# Patient Record
Sex: Female | Born: 1965 | Race: White | Hispanic: No | Marital: Married | Smoking: Former smoker
Health system: Southern US, Community
[De-identification: ages and names within clinical notes are randomized; demographics above are authoritative.]

## PROBLEM LIST (undated history)

## (undated) DIAGNOSIS — B2790 Infectious mononucleosis, unspecified without complication: Secondary | ICD-10-CM

## (undated) DIAGNOSIS — F32A Depression, unspecified: Secondary | ICD-10-CM

## (undated) DIAGNOSIS — E063 Autoimmune thyroiditis: Secondary | ICD-10-CM

## (undated) DIAGNOSIS — K219 Gastro-esophageal reflux disease without esophagitis: Secondary | ICD-10-CM

## (undated) DIAGNOSIS — C50919 Malignant neoplasm of unspecified site of unspecified female breast: Secondary | ICD-10-CM

## (undated) DIAGNOSIS — F419 Anxiety disorder, unspecified: Secondary | ICD-10-CM

## (undated) DIAGNOSIS — F329 Major depressive disorder, single episode, unspecified: Secondary | ICD-10-CM

## (undated) DIAGNOSIS — C801 Malignant (primary) neoplasm, unspecified: Secondary | ICD-10-CM

## (undated) HISTORY — DX: Infectious mononucleosis, unspecified without complication: B27.90

## (undated) HISTORY — DX: Malignant (primary) neoplasm, unspecified: C80.1

## (undated) HISTORY — DX: Depression, unspecified: F32.A

## (undated) HISTORY — DX: Gastro-esophageal reflux disease without esophagitis: K21.9

## (undated) HISTORY — DX: Anxiety disorder, unspecified: F41.9

## (undated) HISTORY — DX: Autoimmune thyroiditis: E06.3

---
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
## 2007-08-23 HISTORY — PX: UTERINE FIBROID SURGERY: SHX826

## 2007-09-07 ENCOUNTER — Emergency Department (HOSPITAL_COMMUNITY): Admission: EM | Admit: 2007-09-07 | Discharge: 2007-09-07 | Payer: Self-pay | Admitting: Emergency Medicine

## 2007-09-20 ENCOUNTER — Ambulatory Visit (HOSPITAL_COMMUNITY): Admission: RE | Admit: 2007-09-20 | Discharge: 2007-09-20 | Payer: Self-pay | Admitting: Obstetrics and Gynecology

## 2007-09-20 ENCOUNTER — Encounter (INDEPENDENT_AMBULATORY_CARE_PROVIDER_SITE_OTHER): Payer: Self-pay | Admitting: Obstetrics and Gynecology

## 2010-03-11 ENCOUNTER — Encounter: Payer: Self-pay | Admitting: Maternal and Fetal Medicine

## 2010-05-25 ENCOUNTER — Encounter: Payer: Self-pay | Admitting: Pediatric Cardiology

## 2010-09-20 ENCOUNTER — Ambulatory Visit: Payer: Self-pay

## 2010-09-21 ENCOUNTER — Inpatient Hospital Stay: Payer: Self-pay

## 2011-01-04 NOTE — H&P (Signed)
NAMEHALLEY, Alexandra Byrd              ACCOUNT NO.:  [id number]   MEDICAL RECORD NO.:  [id number]          PATIENT TYPE:  AMB   LOCATION:  SDC                           FACILITY:  [facility]   PHYSICIAN:  Kendra H. Tenny Craw, MD     DATE OF BIRTH:  1965-09-20   DATE OF ADMISSION:  09/20/2007  DATE OF DISCHARGE:                              HISTORY & PHYSICAL   CHIEF COMPLAINT:  Heavy menstrual bleeding.   HISTORY OF PRESENT ILLNESS:  Ms. Karl Ito is a 45 year old G1, P1 who  presents with a six month history of irregular bleeding and cramping  with passage of clots.  She has noticed over the past 3-4 months, her  periods have become exceedingly irregular with much heavier bleeding and  they are lasting much longer than previous.  Her current period started  on December 19th, and she has continued to bleed since that time, which  has gotten heavier and lighter at times.  She was initially seen in the  office on August 31, 2007 for this complaint, and she was given a 10-day  course of Provera 10 mg p.o. daily.  This failed to stop her bleeding,  and she returned for the same complaint on September 13, 2007.  At that  time, a vaginal ultrasound was performed, which demonstrated a uterus  normal in size with an endometrium that was heterogenous in appearance  and irregularly thickened, measuring anywhere from 1 cm to 1.3 cm.  The  right ovary was within normal limits.  The left ovary had a similar-  appearing cyst on it.  After discussion of expected management versus  surgical management, the decision was made to proceed with a  hysteroscopy D&C for definitive management of dysfunctional uterine  bleeding and menorrhagia.   PAST MEDICAL HISTORY:  1. Hypothyroidism.  2. Gastroesophageal reflux disease.  3. Depression and anxiety.   PAST SURGICAL HISTORY:  1. Cesarean section in 1997 for an infant in breech presentation.  2. Some form of bladder surgery in 1974.   PAST OBSTETRIC HISTORY:  G1, P1,  status post a primary low transverse  cesarean section in 1997 for a breech presentation.   PAST GYN HISTORY:  History of an abnormal Pap x1.  Recheck was within  normal limits.  No sexually transmitted diseases.  Pap smear on August 31, 2007 was normal.   SOCIAL HISTORY:  Patient does smoke a pack of cigarettes daily for the  past two years.  She denies alcohol or drug abuse.   FAMILY HISTORY:  Noncontributory.   CURRENT MEDICATIONS:  1. Cymbalta 60 mg p.o. daily.  2. Synthroid 112 mcg p.o. daily.  3. Klonopin 1 mg p.o. daily.  4. Dexedrine 30 mg 1 p.o. daily.   ALLERGIES:  PENICILLIN, WELLBUTRIN.   PHYSICAL EXAMINATION:  Alert and oriented x3.  No apparent distress.  Blood pressure 126/78, weight 165 pounds.  Height 5 foot 8.  LUNGS: Clear to auscultation bilaterally.  HEART:  Regular rate and rhythm.  ABDOMEN:  Soft, nontender, nondistended.  GENITOURINARY:  Normal external female genitalia.  Vagina is pink and  well rugated.  Menstrual fluid is present.  Cervix is visualized without  any lesions.  No cervical motion tenderness is noted.  Uterus is small  and mid position; however, this is a difficult exam secondary to  involuntary guarding by the patient.  The patient does not demonstrate  any tenderness with this exam.  Adnexa are nontender and nonpalpable.  EXTREMITIES:  No clubbing, cyanosis or edema.   LABS:  White blood cell count of 11.4, hemoglobin 11, hematocrit 33,  platelet count 412.  This was drawn on September 13, 2007.   ASSESSMENT/PLAN:  This is a 45 year old G1, P1 with dysfunctional  uterine bleeding and menorrhagia for a hysteroscopy dilatation and  curettage.  1. Admit as outpatient.  2. Consent for hysteroscopy and D&C.      Freddrick March. Tenny Craw, MD  Electronically Signed     KHR/MEDQ  D:  09/18/2007  T:  09/18/2007  Job:  [id number]

## 2011-01-04 NOTE — Op Note (Signed)
[name], [name]              ACCOUNT NO.:  [id number]   MEDICAL RECORD NO.:  [id number]          PATIENT TYPE:  AMB   LOCATION:  SDC                           [facility]:  [facility]   PHYSICIAN:  Kendra H. Tenny Craw, MD     DATE OF BIRTH:  28-Mar-1966   DATE OF PROCEDURE:  09/20/2007  DATE OF DISCHARGE:                               OPERATIVE REPORT   PREOPERATIVE DIAGNOSIS:  Dysfunctional uterine bleeding.   POSTOPERATIVE DIAGNOSIS:  1. Dysfunctional uterine bleeding.  2. Submucosal uterine fibroids.  3. Endometrial polyps.   PROCEDURE:  Hysteroscopy D&C.   SURGEON:  Dr. Waynard Reeds.   ASSISTANT:  None.   ANESTHESIA:  MAC.   An irregular intrauterine cavity consistent with multiple uterine  endometrial polyps and submucosal fibroids.   SPECIMENS:  Endometrial curettings.   DISPOSITION OF SPECIMENS:  Pathology.   ESTIMATED BLOOD LOSS:  Minimal.   COMPLICATIONS:  None.   PROCEDURE:  Alexandra Byrd is a 45 year old, G1, P1 who has had a several  month history of dysfunctional uterine bleeding and menorrhagia who  presented for this complaint originally on August 31, 2007. At that time  she was given a 10-day course of Provera however, after the Provera she  continued to bleed heavily and the decision was made to proceed with  hysteroscopy D&C for diagnostic and therapeutic treatment. Following the  appropriate informed consent, she was brought to the operating room  where MAC anesthesia was administered and found to be adequate.  She was  placed in the lithotomy position in [place] stirrups, prepped and draped  in the normal sterile fashion.  A Graves speculum was placed in the  vagina.  A single-tooth tenaculum was used to grasp the anterior lip of  the cervix. 10 mL of 1% lidocaine was injected circumferentially in a  paracervical fashion. The uterus was sounded to 6 cm, the cervix was  serially dilated. The hysteroscope was then introduced transcervically  with direct  visualization.  Once inside the uterine cavity, the  endometrial cavity itself was noted to be irregular, lumpy, bumpy  appearing with multiple polyps noted and at least one submucosal  fibroid.  The tubal ostia were visualized at the fundus on both sides.  The hysteroscope was then removed and a sharp curettage was performed.  Again the sharp curettage did demonstrate the irregular nature of the  endometrial cavity consistent with submucosal fibroids, mainly  anteriorly. Endometrial curettings were removed. Also it did appear that  at least one very small fibroid was removed with the endometrial  curettings, three curettage passes were performed until a gritty texture  was noted. The hysteroscope was then free introduced through the cervix  into the intrauterine cavity and at this time the endometrial cavity was  much more smooth in appearance.  No further polyps could be identified.  The hysteroscope was removed from the intrauterine cavity.  The single-  toothed tenaculum was removed from the anterior lip of the cervix and  the Graves speculum was removed from the vagina. This completed the  operative case.  The patient was taken out of  the lithotomy position and  was brought to the recovery room in stable condition following the  procedure.      Freddrick March. Tenny Craw, MD  Electronically Signed     KHR/MEDQ  D:  09/20/2007  T:  09/20/2007  Job:  [id number]

## 2011-05-13 LAB — CBC
HCT: 33.3 — ABNORMAL LOW
Hemoglobin: 11 — ABNORMAL LOW
RDW: 13.3
WBC: 11.8 — ABNORMAL HIGH

## 2011-05-13 LAB — BASIC METABOLIC PANEL
BUN: 7
CO2: 25
Calcium: 8.5
Chloride: 104
Creatinine, Ser: 0.57
GFR calc Af Amer: >60
GFR calc non Af Amer: >60
Glucose, Bld: 96
Potassium: 3.8
Sodium: 137

## 2011-05-13 LAB — DIFFERENTIAL
Basophils Absolute: 0
Lymphocytes Relative: 17
Lymphs Abs: 2.1
Monocytes Absolute: 1.2 — ABNORMAL HIGH
Neutro Abs: 7.8 — ABNORMAL HIGH

## 2011-05-13 LAB — TYPE AND SCREEN: ABO/RH(D): A POS

## 2011-05-13 LAB — PREGNANCY, URINE: Preg Test, Ur: NEGATIVE

## 2011-05-13 LAB — ABO/RH: ABO/RH(D): A POS

## 2013-10-11 ENCOUNTER — Ambulatory Visit (HOSPITAL_COMMUNITY): Payer: Self-pay

## 2014-04-14 ENCOUNTER — Telehealth: Payer: Self-pay | Admitting: Orthopedic Surgery

## 2014-04-14 NOTE — Telephone Encounter (Signed)
Patient called to inquire about appointment for upper arm pain/shoulder area; states has treated with an integrative medicine provider in [facility], but is not getting much better.  Relayed treatment notes would be needed for Dr Aline Brochure to review.  States will request Dr. Tye Savoy in [place] to fax referral.

## 2014-04-21 NOTE — Telephone Encounter (Signed)
Referral received for schedule of appointment. Patient aware.

## 2014-05-06 ENCOUNTER — Ambulatory Visit (INDEPENDENT_AMBULATORY_CARE_PROVIDER_SITE_OTHER): Payer: BC Managed Care – PPO

## 2014-05-06 ENCOUNTER — Ambulatory Visit (INDEPENDENT_AMBULATORY_CARE_PROVIDER_SITE_OTHER): Payer: BC Managed Care – PPO | Admitting: Orthopedic Surgery

## 2014-05-06 VITALS — BP 111/79 | Ht 68.0 in | Wt 176.0 lb

## 2014-05-06 DIAGNOSIS — M75101 Unspecified rotator cuff tear or rupture of right shoulder, not specified as traumatic: Secondary | ICD-10-CM

## 2014-05-06 DIAGNOSIS — M7550 Bursitis of unspecified shoulder: Secondary | ICD-10-CM

## 2014-05-06 DIAGNOSIS — M25519 Pain in unspecified shoulder: Secondary | ICD-10-CM

## 2014-05-06 DIAGNOSIS — M25512 Pain in left shoulder: Secondary | ICD-10-CM

## 2014-05-06 DIAGNOSIS — M67919 Unspecified disorder of synovium and tendon, unspecified shoulder: Secondary | ICD-10-CM

## 2014-05-06 DIAGNOSIS — M719 Bursopathy, unspecified: Secondary | ICD-10-CM

## 2014-05-06 DIAGNOSIS — M75102 Unspecified rotator cuff tear or rupture of left shoulder, not specified as traumatic: Principal | ICD-10-CM

## 2014-05-06 MED ORDER — NABUMETONE 500 MG PO TABS: 500.0000 mg | ORAL_TABLET | Freq: Two times a day (BID) | ORAL | Status: DC

## 2014-05-06 NOTE — Progress Notes (Signed)
HPI:  Chief Complaint  Patient presents with  . Arm Pain    left upper arm pain x few months, no known injury   Her Tye Savoy is the physician. [facility] as the pharmacy. Complains of left and right upper shoulder pain x2 months no injury. Complains of sharp aching radiating pain left greater than right but both in the same areas of the left and right deltoid. Treatment includes a muscle rub. She has difficulty with internal.    ROS review of systems are negative except for heartburn joint pain limb pain muscle weakness difficulty moving her arm and pain in the joint. She also has headaches balance problems numbness tingling thyroid disorder and temperature disturbance anxiety and tailbone pain causing leg pain.  Surgery includes cesarean section fibroid excision and cesarean section again in 2012 the first one being in 1997 she takes following medications  Vitamin D3 vitamin K progesterone and another hormone she also takes ARMOUR  Medical history diabetes and thyroid disease family history of diabetes hypertension stroke heart attack cancer depression mental illness thyroid disease and history of penicillin causing a rash PHYSICAL EXAM  VITAL SIGNS: BP 111/79  Ht 5\' 8"  (1.727 m)  Wt 176 lb (79.833 kg)  BMI 26.77 kg/m2  LMP 05/06/2014   GENERAL well-developed well-nourished grooming hygiene normal  MENTAL STATUS alert and oriented x3  MOOD/AFFECT ARE NORMAL   GAIT normal without disturbance   EXAM OF THE CERVICAL spine and thoracic spine show normal skin no deformities no increased muscle tension and no range of motion deficit  Left and right shoulder: The skin is normal she has tenderness in the peri-acromial region. She has full passive motion in flexion and abduction external rotation with decreased range of motion in internal rotation, T7 on the right with T4 on the left. Both shoulders are stable the rotator cuff is intact  She has bilateral positive  impingement signs  Neurovascular exam is intact including bilateral equal and symmetric reflexes she has no evidence of lymphadenopathy in either axilla and sensory exam remains normal bilaterally   IMAGING STUDIES left shoulder x-ray looks normal   Dx bilateral rotator cuff syndrome   PLAN  bilateral shoulder injection Physical therapy both shoulders Meds ordered this encounter  Medications  . nabumetone (RELAFEN) 500 MG tablet    Sig: Take 1 tablet (500 mg total) by mouth 2 (two) times daily.    Dispense:  90 tablet    Refill:  0   Procedure note the subacromial injection shoulder right and left  Verbal consent was obtained to inject the  right and left  Shoulder  Timeout was completed to confirm the injection site is a subacromial space of the  right and left shoulder   Medication used Depo-Medrol 40 mg and lidocaine 1% 3 cc  Anesthesia was provided by ethyl chloride  The injection was performed in the right shoulder first followed by the left shoulder posterior subacromial space. After pinning the skin with alcohol and anesthetized the skin with ethyl chloride the subacromial space was injected using a 20-gauge needle. There were no complications  Sterile dressing was applied.

## 2014-05-06 NOTE — Patient Instructions (Signed)
Call to arrange therapy- [place]  Medication sent to your pharmacy

## 2015-08-23 HISTORY — PX: MASTECTOMY: SHX3

## 2015-10-23 ENCOUNTER — Other Ambulatory Visit: Payer: Self-pay | Admitting: Certified Nurse Midwife

## 2015-10-23 DIAGNOSIS — N63 Unspecified lump in unspecified breast: Secondary | ICD-10-CM

## 2015-11-03 ENCOUNTER — Ambulatory Visit
Admission: RE | Admit: 2015-11-03 | Discharge: 2015-11-03 | Disposition: A | Discharge status: Home / Self Care | Payer: BLUE CROSS/BLUE SHIELD | Source: Ambulatory Visit | Attending: Certified Nurse Midwife | Admitting: Certified Nurse Midwife

## 2015-11-03 DIAGNOSIS — N63 Unspecified lump in unspecified breast: Secondary | ICD-10-CM

## 2015-11-03 DIAGNOSIS — R921 Mammographic calcification found on diagnostic imaging of breast: Secondary | ICD-10-CM | POA: Insufficient documentation

## 2015-11-04 ENCOUNTER — Other Ambulatory Visit: Payer: Self-pay | Admitting: Certified Nurse Midwife

## 2015-11-04 DIAGNOSIS — R921 Mammographic calcification found on diagnostic imaging of breast: Secondary | ICD-10-CM

## 2015-11-04 DIAGNOSIS — N6320 Unspecified lump in the left breast, unspecified quadrant: Secondary | ICD-10-CM

## 2015-11-06 ENCOUNTER — Ambulatory Visit
Admission: RE | Admit: 2015-11-06 | Discharge: 2015-11-06 | Disposition: A | Discharge status: Home / Self Care | Payer: BLUE CROSS/BLUE SHIELD | Source: Ambulatory Visit | Attending: Certified Nurse Midwife | Admitting: Certified Nurse Midwife

## 2015-11-06 DIAGNOSIS — N6320 Unspecified lump in the left breast, unspecified quadrant: Secondary | ICD-10-CM

## 2015-11-06 DIAGNOSIS — R921 Mammographic calcification found on diagnostic imaging of breast: Secondary | ICD-10-CM

## 2015-11-06 DIAGNOSIS — N63 Unspecified lump in breast: Secondary | ICD-10-CM | POA: Diagnosis present

## 2015-11-06 DIAGNOSIS — C801 Malignant (primary) neoplasm, unspecified: Secondary | ICD-10-CM

## 2015-11-06 DIAGNOSIS — C50912 Malignant neoplasm of unspecified site of left female breast: Secondary | ICD-10-CM | POA: Diagnosis not present

## 2015-11-06 HISTORY — PX: BREAST BIOPSY: SHX20

## 2015-11-06 HISTORY — DX: Malignant (primary) neoplasm, unspecified: C80.1

## 2015-11-09 LAB — SURGICAL PATHOLOGY

## 2015-11-12 ENCOUNTER — Encounter: Payer: Self-pay | Admitting: *Deleted

## 2015-11-12 ENCOUNTER — Other Ambulatory Visit: Payer: Self-pay | Admitting: *Deleted

## 2015-11-12 ENCOUNTER — Encounter: Payer: Self-pay | Admitting: General Surgery

## 2015-11-12 ENCOUNTER — Ambulatory Visit (INDEPENDENT_AMBULATORY_CARE_PROVIDER_SITE_OTHER): Payer: BLUE CROSS/BLUE SHIELD | Admitting: General Surgery

## 2015-11-12 VITALS — BP 132/76 | HR 100 | Resp 16 | Ht 68.0 in | Wt 187.0 lb

## 2015-11-12 DIAGNOSIS — D0512 Intraductal carcinoma in situ of left breast: Secondary | ICD-10-CM

## 2015-11-12 NOTE — Patient Instructions (Addendum)
Ductal Carcinoma in Situ  Ductal carcinoma in situ is a growth of abnormal cells in the breast. The abnormal cells are located in the tubes that carry milk to the nipple (milk ducts) and have not spread to other areas. Ductal carcinoma in situ is the earliest form of breast cancer.  CAUSES  The exact cause of ductal carcinoma in situ is not known.  RISK FACTORS  Risk factors for ductal carcinoma in situ include:  · Age. Your risk increases as you get older.  · Family history of breast cancer.  · Having prior radiation treatments to your breasts or chest area.  · Being overweight.  · Using or having used hormones, such as estrogen.  · Prior history of:    Breast cancer.    Noncancerous breast conditions.    Dense breasts.  · Drinking more than one alcoholic beverage per day.  · Starting menstruation before the age of 12.  · Never having given birth.  · Giving birth to your first child when you were over the age of 35.  · Not breastfeeding, if you have given birth.  · Not exercising consistently.  SIGNS AND SYMPTOMS  Ductal carcinoma in situ does not cause any symptoms.  DIAGNOSIS   Ductal carcinoma in situ is usually discovered during a routine X-ray study of the breasts (mammogram). To diagnose the condition, your health care provider will take a tissue sample from your breast so it can be examined under a microscope (breast biopsy).  TREATMENT  Ductal carcinoma in situ treatment may include:  · A lumpectomy. This is the removal of the area of abnormal cells, along with a ring of normal tissue.  This may also be called breast-conserving surgery.  · A simple mastectomy. This is the removal of breast tissue, the nipple, and the circle of colored tissue around the nipple (areola). Sometimes, one or more lymph nodes from under the arm are also removed.  · Preventative mastectomy. This is the removal of both breasts. This is usually done only if you have a very high risk of developing breast cancer.  · Radiation. This is  a treatment that uses X-rays to kill cancer cells.  · Medicines to keep the cancer from spreading.  HOME CARE INSTRUCTIONS  · Take medicines only as directed by your health care provider.  · Keep all follow-up visits as directed by your health care provider. This is important.  · Limit alcohol intake to no more than 1 drink per day for nonpregnant women. One drink equals 12 ounces of beer, 5 ounces of wine, or 1½ ounces of hard liquor.  SEEK MEDICAL CARE IF:  You have a fever.  SEEK IMMEDIATE MEDICAL CARE IF:  You have difficulty breathing.     This information is not intended to replace advice given to you by your health care provider. Make sure you discuss any questions you have with your health care provider.     Document Released: 03/05/2014 Document Reviewed: 03/05/2014  Elsevier Interactive Patient Education ©2016 [employer]vier Inc.

## 2015-11-12 NOTE — Progress Notes (Signed)
Patient ID: Alexandra Byrd, female   DOB: [birth year], 50 y.o.   MRN: [id number]  Chief Complaint  Patient presents with  . Breast Problem    DCIS    HPI Alexandra Byrd is a 50 y.o. female.  who presents for a breast evaluation. The most recent mammogram with biopsy was done on 11-06-15.  Patient does not perform regular self breast checks and does not get regular mammograms done. Patient stated her GYN doctor found a lump during her exam 2 weeks ago. Denies pain and tenderness. She states that she previously had a HALO test done in 2014, no mammograms since. She stays at home with her daughter.  She states she has a rash on her right and left forearm that has been present since November 2016. Patient was accompanied by her husband and daughter.  I have reviewed the history of present illness with the patient.  HPI  Past Medical History  Diagnosis Date  . Cancer ([place]) 11-06-15    left breast  . Hashimoto's thyroiditis   . EBV infection   . GERD (gastroesophageal reflux disease)     Past Surgical History  Procedure Laterality Date  . Breast biopsy Left 11-06-15    DUCTAL CARCINOMA IN SITU, NUCLEAR GRADE 2,  . Cesarean section  1997, 2012  . Bladder surgery  1974  . Uterine fibroid surgery  2009    Family History  Problem Relation Age of Onset  . Cancer Mother     thyroid/throat    Social History Social History  Substance Use Topics  . Smoking status: Former Smoker -- 5 years  . Smokeless tobacco: Never Used  . Alcohol Use: None    Allergies  Allergen Reactions  . Penicillins Rash    Current Outpatient Prescriptions  Medication Sig Dispense Refill  . ARMOUR THYROID 60 MG tablet TK 2 TS PO IN THE MORNING  2  . Cholecalciferol (VITAMIN D3) 5000 units CAPS Take by mouth.    . Menaquinone-7 (VITAMIN K2) 100 MCG CAPS Take by mouth.    . progesterone (PROMETRIUM) 100 MG capsule      No current facility-administered medications for this visit.    Review of  Systems Review of Systems  Constitutional: Negative.   Respiratory: Negative.   Cardiovascular: Negative.     Blood pressure 132/76, pulse 100, resp. rate 16, height 5\' 8"  (1.727 m), weight 187 lb (84.823 kg), last menstrual period 11/04/2015.  Physical Exam Physical Exam  Constitutional: She is oriented to person, place, and time. She appears well-developed and well-nourished.  Eyes: Conjunctivae are normal. No scleral icterus.  Neck: Neck supple. No thyromegaly present.  Cardiovascular: Normal rate, regular rhythm and normal heart sounds.   Pulmonary/Chest: Effort normal and breath sounds normal. Right breast exhibits no inverted nipple, no mass, no nipple discharge, no skin change and no tenderness. Left breast exhibits mass. Left breast exhibits no inverted nipple, no nipple discharge, no skin change and no tenderness.  2 cm ill-defined mass in left breast 6 cm from nipple at 2 ocl location.  Lymphadenopathy:    She has no cervical adenopathy.    She has no axillary adenopathy.  Neurological: She is alert and oriented to person, place, and time.  Skin: Skin is warm and dry. Rash (Right and left forearm punctate, scattered) noted.  Psychiatric: She has a normal mood and affect. Her behavior is normal.    Data Reviewed Mammogram/ultrasound and notes reviewed. Mammoogram, density with microcalcifications in 2 locations of  left breast-US showing both areas with hypoechoic masses.  Biopsy of both these sites on left breast at 2ocl, subareolar and 6 cmfn-grade 2 DCIS. 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 SN biopsy can be done at same time. Discussed in detail current diagnosis, potential invasive component, role of hormones. Procedure of mastectomy, risks and benefits explained. Pt is agreeable.            Patient had lab  work this month done in [facility], she will bring labs for review.       PCP:  Mylinda Latina Ref: Clarita Leber   This information has been scribed by Verlene Mayer, CMA   Christene Lye 11/12/2015, 3:50 PM

## 2015-11-12 NOTE — Progress Notes (Signed)
Patient ID: Alexandra Byrd, female   DOB: 1966-06-10, 50 y.o.   MRN: [id number]  Patient's surgery has been scheduled for 11-24-15 at [facility].

## 2015-11-13 ENCOUNTER — Encounter: Payer: Self-pay | Admitting: *Deleted

## 2015-11-13 NOTE — Progress Notes (Signed)
  Oncology Nurse Navigator Documentation  Navigator Location: [facility] (11/13/15 1500) Navigator Encounter Type: Introductory phone call (11/13/15 1500)   Abnormal Finding Date: 11/03/15 (11/13/15 1500) Confirmed Diagnosis Date: 11/06/15 (11/13/15 1500) Surgery Date: 11/24/15 (11/13/15 1500) Treatment Initiated Date: 11/24/15 (11/13/15 1500)     Barriers/Navigation Needs: Education (11/13/15 1500) Education: Newly Diagnosed Cancer Education (11/13/15 1500)              Acuity: Level 2 (11/13/15 1500)         Time Spent with Patient: 30 (11/13/15 1500)   Talked to patient today to establish navigation services.  Patient is scheduled for surgery on 11/24/15.  Will [employer] on Monday.  She is to call if she has any questions or needs.

## 2015-11-16 ENCOUNTER — Telehealth: Payer: Self-pay | Admitting: *Deleted

## 2015-11-16 ENCOUNTER — Ambulatory Visit: Payer: Self-pay

## 2015-11-16 ENCOUNTER — Other Ambulatory Visit: Payer: Self-pay

## 2015-11-16 NOTE — Telephone Encounter (Signed)
Patient was contacted today to notify her of her arrival time the day of surgery which is scheduled for 11-24-15 at [facility]. This patient verbalizes understanding.   This patient also mentioned that her husband would like for her to get a second opinion with the [place]. They are supposed to be in touch with patient about getting an appointment set up.   Patient wishes to leave surgery as scheduled for now but may call at some point to cancel to allow for second opinion prior to surgery.

## 2015-11-18 ENCOUNTER — Telehealth: Payer: Self-pay | Admitting: *Deleted

## 2015-11-18 ENCOUNTER — Other Ambulatory Visit: Payer: BC Managed Care – PPO

## 2015-11-18 NOTE — Telephone Encounter (Signed)
Patient called to report that she wants to cancel left breast mastectomy with SLN node that was scheduled for 11-24-15 at [facility].  This patient states that she is going to get a second opinion from the [place] before proceeding with any surgery.   Leah in the [place] been notified of cancellation today as well as Felicia from Scheduling and Aaron Edelman from Nuclear Medicine.

## 2015-11-20 ENCOUNTER — Encounter
Admission: RE | Disposition: A | Discharge status: Home / Self Care | Payer: Self-pay | Source: Ambulatory Visit | Attending: Obstetrics & Gynecology

## 2015-11-20 ENCOUNTER — Ambulatory Visit: Payer: BLUE CROSS/BLUE SHIELD | Admitting: Anesthesiology

## 2015-11-20 ENCOUNTER — Ambulatory Visit
Admission: RE | Admit: 2015-11-20 | Discharge: 2015-11-20 | Disposition: A | Discharge status: Home / Self Care | Payer: BLUE CROSS/BLUE SHIELD | Source: Ambulatory Visit | Attending: Obstetrics & Gynecology | Admitting: Obstetrics & Gynecology

## 2015-11-20 DIAGNOSIS — Z833 Family history of diabetes mellitus: Secondary | ICD-10-CM | POA: Diagnosis not present

## 2015-11-20 DIAGNOSIS — K219 Gastro-esophageal reflux disease without esophagitis: Secondary | ICD-10-CM | POA: Diagnosis not present

## 2015-11-20 DIAGNOSIS — Z9851 Tubal ligation status: Secondary | ICD-10-CM | POA: Insufficient documentation

## 2015-11-20 DIAGNOSIS — Z808 Family history of malignant neoplasm of other organs or systems: Secondary | ICD-10-CM | POA: Insufficient documentation

## 2015-11-20 DIAGNOSIS — Z79899 Other long term (current) drug therapy: Secondary | ICD-10-CM | POA: Diagnosis not present

## 2015-11-20 DIAGNOSIS — Z9889 Other specified postprocedural states: Secondary | ICD-10-CM | POA: Diagnosis not present

## 2015-11-20 DIAGNOSIS — N8500 Endometrial hyperplasia, unspecified: Secondary | ICD-10-CM | POA: Diagnosis not present

## 2015-11-20 DIAGNOSIS — Z853 Personal history of malignant neoplasm of breast: Secondary | ICD-10-CM | POA: Insufficient documentation

## 2015-11-20 DIAGNOSIS — E063 Autoimmune thyroiditis: Secondary | ICD-10-CM | POA: Diagnosis not present

## 2015-11-20 DIAGNOSIS — N939 Abnormal uterine and vaginal bleeding, unspecified: Secondary | ICD-10-CM | POA: Insufficient documentation

## 2015-11-20 DIAGNOSIS — Z880 Allergy status to penicillin: Secondary | ICD-10-CM | POA: Diagnosis not present

## 2015-11-20 DIAGNOSIS — D0512 Intraductal carcinoma in situ of left breast: Secondary | ICD-10-CM

## 2015-11-20 HISTORY — PX: HYSTEROSCOPY W/D&C: SHX1775

## 2015-11-20 LAB — TYPE AND SCREEN
ABO/RH(D): A POS
Antibody Screen: NEGATIVE

## 2015-11-20 LAB — ABO/RH: ABO/RH(D): A POS

## 2015-11-20 LAB — POCT PREGNANCY, URINE: Preg Test, Ur: NEGATIVE

## 2015-11-20 SURGERY — DILATATION AND CURETTAGE /HYSTEROSCOPY
Anesthesia: General | Wound class: Clean Contaminated

## 2015-11-20 MED ORDER — EPHEDRINE SULFATE 50 MG/ML IJ SOLN
INTRAMUSCULAR | Status: DC | PRN
  Administered 2015-11-20: 10 mg via INTRAVENOUS

## 2015-11-20 MED ORDER — ACETAMINOPHEN 650 MG RE SUPP
650.0000 mg | RECTAL | Status: DC | PRN
  Filled 2015-11-20: qty 1

## 2015-11-20 MED ORDER — FENTANYL CITRATE (PF) 100 MCG/2ML IJ SOLN
25.0000 ug | INTRAMUSCULAR | Status: DC | PRN
  Administered 2015-11-20 (×4): 25 ug via INTRAVENOUS

## 2015-11-20 MED ORDER — SUGAMMADEX SODIUM 200 MG/2ML IV SOLN
INTRAVENOUS | Status: DC | PRN
  Administered 2015-11-20: 167.8 mg via INTRAVENOUS

## 2015-11-20 MED ORDER — DEXAMETHASONE SODIUM PHOSPHATE 10 MG/ML IJ SOLN
INTRAMUSCULAR | Status: DC | PRN
  Administered 2015-11-20: 10 mg via INTRAVENOUS

## 2015-11-20 MED ORDER — LIDOCAINE HCL 1 % IJ SOLN
INTRAMUSCULAR | Status: DC | PRN
  Administered 2015-11-20: 10 mL

## 2015-11-20 MED ORDER — LACTATED RINGERS IV BOLUS (SEPSIS)
500.0000 mL | Freq: Once | INTRAVENOUS | Status: AC
  Administered 2015-11-20: 500 mL via INTRAVENOUS

## 2015-11-20 MED ORDER — ROCURONIUM BROMIDE 100 MG/10ML IV SOLN
INTRAVENOUS | Status: DC | PRN
  Administered 2015-11-20: 5 mg via INTRAVENOUS
  Administered 2015-11-20: 15 mg via INTRAVENOUS

## 2015-11-20 MED ORDER — ACETAMINOPHEN 325 MG PO TABS: 650.0000 mg | ORAL_TABLET | ORAL | Status: DC | PRN

## 2015-11-20 MED ORDER — FENTANYL CITRATE (PF) 100 MCG/2ML IJ SOLN
INTRAMUSCULAR | Status: AC
  Administered 2015-11-20: 25 ug via INTRAVENOUS
  Filled 2015-11-20: qty 2

## 2015-11-20 MED ORDER — MIDAZOLAM HCL 2 MG/2ML IJ SOLN
INTRAMUSCULAR | Status: DC | PRN
  Administered 2015-11-20: 2 mg via INTRAVENOUS

## 2015-11-20 MED ORDER — PROPOFOL 10 MG/ML IV BOLUS
INTRAVENOUS | Status: DC | PRN
  Administered 2015-11-20: 150 mg via INTRAVENOUS
  Administered 2015-11-20: 50 mg via INTRAVENOUS

## 2015-11-20 MED ORDER — SUCCINYLCHOLINE CHLORIDE 20 MG/ML IJ SOLN
INTRAMUSCULAR | Status: DC | PRN
  Administered 2015-11-20: 100 mg via INTRAVENOUS

## 2015-11-20 MED ORDER — CEFAZOLIN SODIUM-DEXTROSE 2-4 GM/100ML-% IV SOLN: 2.0000 g | INTRAVENOUS | Status: DC

## 2015-11-20 MED ORDER — CEFAZOLIN SODIUM-DEXTROSE 2-4 GM/100ML-% IV SOLN
INTRAVENOUS | Status: AC
  Filled 2015-11-20: qty 100

## 2015-11-20 MED ORDER — KETOROLAC TROMETHAMINE 30 MG/ML IJ SOLN
INTRAMUSCULAR | Status: AC
  Administered 2015-11-20: 30 mg via INTRAVENOUS
  Filled 2015-11-20: qty 1

## 2015-11-20 MED ORDER — KETOROLAC TROMETHAMINE 30 MG/ML IJ SOLN
30.0000 mg | Freq: Four times a day (QID) | INTRAMUSCULAR | Status: DC
  Administered 2015-11-20: 30 mg via INTRAVENOUS
  Filled 2015-11-20 (×5): qty 1

## 2015-11-20 MED ORDER — SILVER NITRATE-POT NITRATE 75-25 % EX MISC
CUTANEOUS | Status: DC | PRN
  Administered 2015-11-20: 2

## 2015-11-20 MED ORDER — SILVER NITRATE-POT NITRATE 75-25 % EX MISC
CUTANEOUS | Status: AC
  Filled 2015-11-20: qty 1

## 2015-11-20 MED ORDER — CHLORHEXIDINE GLUCONATE 4 % EX LIQD
Freq: Once | CUTANEOUS | Status: DC
Start: 2015-11-20 — End: 2015-11-20

## 2015-11-20 MED ORDER — IBUPROFEN 600 MG PO TABS: 600.0000 mg | ORAL_TABLET | Freq: Four times a day (QID) | ORAL | Status: DC | PRN

## 2015-11-20 MED ORDER — LIDOCAINE HCL (CARDIAC) 20 MG/ML IV SOLN
INTRAVENOUS | Status: DC | PRN
  Administered 2015-11-20: 60 mg via INTRAVENOUS

## 2015-11-20 MED ORDER — ONDANSETRON HCL 4 MG/2ML IJ SOLN
INTRAMUSCULAR | Status: DC | PRN
  Administered 2015-11-20: 4 mg via INTRAVENOUS

## 2015-11-20 MED ORDER — ONDANSETRON HCL 4 MG/2ML IJ SOLN: 4.0000 mg | Freq: Once | INTRAMUSCULAR | Status: DC | PRN

## 2015-11-20 MED ORDER — LIDOCAINE HCL (PF) 1 % IJ SOLN
INTRAMUSCULAR | Status: AC
  Filled 2015-11-20: qty 30

## 2015-11-20 MED ORDER — PHENYLEPHRINE HCL 10 MG/ML IJ SOLN
INTRAMUSCULAR | Status: DC | PRN
  Administered 2015-11-20 (×4): 100 ug via INTRAVENOUS

## 2015-11-20 MED ORDER — MORPHINE SULFATE (PF) 2 MG/ML IV SOLN: 1.0000 mg | INTRAVENOUS | Status: DC | PRN

## 2015-11-20 MED ORDER — FENTANYL CITRATE (PF) 100 MCG/2ML IJ SOLN
INTRAMUSCULAR | Status: DC | PRN
  Administered 2015-11-20 (×2): 50 ug via INTRAVENOUS

## 2015-11-20 MED ORDER — CHLORHEXIDINE GLUCONATE 4 % EX LIQD: 1.0000 "application " | Freq: Once | CUTANEOUS | Status: DC

## 2015-11-20 MED ORDER — LACTATED RINGERS IV SOLN
INTRAVENOUS | Status: DC
  Administered 2015-11-20 (×2): via INTRAVENOUS

## 2015-11-20 SURGICAL SUPPLY — 17 items
CORD URO TURP 10FT (MISCELLANEOUS) IMPLANT
ELECT REM PT RETURN 9FT ADLT (ELECTROSURGICAL) ×2
ELECT RESECT POWERBALL 24F (MISCELLANEOUS) IMPLANT
ELECTRODE REM PT RTRN 9FT ADLT (ELECTROSURGICAL) ×1 IMPLANT
GLOVE PI ORTHOPRO 6.5 (GLOVE) ×1
GLOVE PI ORTHOPRO STRL 6.5 (GLOVE) ×1 IMPLANT
GLOVE SURG SYN 6.5 ES PF (GLOVE) ×2 IMPLANT
GOWN STRL REUS W/ TWL LRG LVL3 (GOWN DISPOSABLE) ×2 IMPLANT
GOWN STRL REUS W/TWL LRG LVL3 (GOWN DISPOSABLE) ×2
IV LACTATED RINGERS 1000ML (IV SOLUTION) ×2 IMPLANT
KIT RM TURNOVER CYSTO AR (KITS) ×2 IMPLANT
NEEDLE SPNL 22GX3.5 QUINCKE BK (NEEDLE) ×2 IMPLANT
PACK DNC HYST (MISCELLANEOUS) ×2 IMPLANT
PAD OB MATERNITY 4.3X12.25 (PERSONAL CARE ITEMS) ×2 IMPLANT
PAD PREP 24X41 OB/GYN DISP (PERSONAL CARE ITEMS) ×2 IMPLANT
SYRINGE 10CC LL (SYRINGE) ×2 IMPLANT
TUBING CONNECTING 10 (TUBING) ×2 IMPLANT

## 2015-11-20 NOTE — Anesthesia Postprocedure Evaluation (Signed)
Anesthesia Post Note  Patient: Alexandra Byrd  Procedure(s) Performed: Procedure(s) (LRB): DILATATION AND CURETTAGE /HYSTEROSCOPY (N/A)  Patient location during evaluation: PACU Anesthesia Type: General Level of consciousness: awake and alert Pain management: pain level controlled Vital Signs Assessment: post-procedure vital signs reviewed and stable Respiratory status: spontaneous breathing and respiratory function stable Cardiovascular status: stable Anesthetic complications: no    Last Vitals:  Filed Vitals:   11/20/15 1654 11/20/15 1711  BP: 110/53 131/65  Pulse: 103 116  Temp: 36.2 C   Resp: 16 15    Last Pain: There were no vitals filed for this visit.               Elim Economou K

## 2015-11-20 NOTE — Anesthesia Procedure Notes (Signed)
Procedure Name: Intubation Date/Time: 11/20/2015 4:06 PM Performed by: Aline Brochure Pre-anesthesia Checklist: Emergency Drugs available, Patient identified, Suction available and Patient being monitored Patient Re-evaluated:Patient Re-evaluated prior to inductionPreoxygenation: Pre-oxygenation with 100% oxygen Intubation Type: IV induction and Cricoid Pressure applied Ventilation: Mask ventilation with difficulty Laryngoscope Size: Mac and 3 Grade View: Grade III Tube type: Oral Tube size: 7.0 mm Number of attempts: 1 Airway Equipment and Method: Bougie stylet Placement Confirmation: positive ETCO2 and breath sounds checked- equal and bilateral Secured at: 22 cm Tube secured with: Tape Dental Injury: Teeth and Oropharynx as per pre-operative assessment  Difficulty Due To: Difficult Airway- due to anterior larynx Future Recommendations: Recommend- induction with short-acting agent, and alternative techniques readily available

## 2015-11-20 NOTE — Progress Notes (Signed)
Up to bsc felt like she needed to void  No success

## 2015-11-20 NOTE — Transfer of Care (Signed)
Immediate Anesthesia Transfer of Care Note  Patient: Alexandra Byrd  Procedure(s) Performed: Procedure(s): DILATATION AND CURETTAGE /HYSTEROSCOPY (N/A)  Patient Location: PACU  Anesthesia Type:General  Level of Consciousness: awake, alert  and oriented  Airway & Oxygen Therapy: Patient Spontanous Breathing and Patient connected to face mask oxygen  Post-op Assessment: Report given to RN and Post -op Vital signs reviewed and stable  Post vital signs: stable  Last Vitals:  Filed Vitals:   11/20/15 1413 11/20/15 1654  BP: 122/93 110/53  Pulse: 100 103  Temp: 36.8 C 36.2 C  Resp: 16 16    Complications: No apparent anesthesia complications

## 2015-11-20 NOTE — Op Note (Signed)
Operative Report Hysteroscopy, Dilation and Curettage 11/20/2015  Patient:  Alexandra Byrd  50 y.o. female Preoperative diagnosis:  abnormal uterine bleeding Postoperative diagnosis:  abnormal uterine bleeding  PROCEDURE:  Procedure(s): DILATATION AND CURETTAGE /HYSTEROSCOPY (N/A) Surgeon:  Surgeon(s) and Role:    * Janaisa Birkland Loletha Grayer Carold Eisner, MD - Primary  Anesthesia:  general I/O:  600cc in, none out, minimal blood loss Specimens:  Endometrial curettings Complications: None Apparent Disposition:  VS stable to PACU  Findings: Uterus, mobile, normal size, sounding to 8 cm; normal cervix, vagina, perineum. Operative findings minimally fluffy endometrium, no polyps, no scarring, no masses.  Indication for procedure/Consents: 49 y.o. G2P2  here for scheduled surgery for the aforementioned diagnoses.   Risks of surgery were discussed with the patient including but not limited to: bleeding which may require transfusion; infection which may require antibiotics; injury to uterus or surrounding organs; intrauterine scarring which may impair future fertility; need for additional procedures including laparotomy or laparoscopy; and other postoperative/anesthesia complications. Written informed consent was obtained.    Procedure Details:   The patient was then taken to the operating room where anesthesia was administered and was found to be adequate.  After a formal and adequate timeout was performed, she was placed in the dorsal lithotomy position and examined with the above findings. She was then prepped and draped in the sterile manner.  A speculum was then placed in the patient's vagina and a single tooth tenaculum was applied to the anterior lip of the cervix.  A paracervical block was placed.  The uterus was sounded to 8cm. Her cervix was serially dilated to accommodate the hysteroscope, with findings as above. A sharp curettage was then performed until there was a gritty texture in all four quadrants. The  specimen was handed off to nursing.  The camera was reinserted and confirmed the uterus had been evacuated. The tenaculum was removed from the anterior lip of the cervix and the vaginal speculum was removed after noting good hemostasis. The patient tolerated the procedure well and was taken to the recovery area awake, extubated and in stable condition.  The patient will be discharged to home as per PACU criteria.  Routine postoperative instructions given. She will follow up in the clinic in two to four weeks for postoperative evaluation.  Larey Days, MD [facility] OBGYN Attending Gynecologist

## 2015-11-20 NOTE — H&P (Signed)
H&P Update  PLEASE SEE PAPER H&P  Pt was last seen in my office, and complete history and physical performed.  The surgical history has been reviewed and remains accurate without interval change. The patient was re-examined and patient's physiologic condition has not changed significantly in the last 30 days.  No new pharmacological allergies or types of therapy has been initiated.  Allergies  Allergen Reactions  . Penicillins Rash    Past Medical History  Diagnosis Date  . Cancer ([place]) 11-06-15    left breast  . Hashimoto's thyroiditis   . EBV infection   . GERD (gastroesophageal reflux disease)    Past Surgical History  Procedure Laterality Date  . Breast biopsy Left 11-06-15    DUCTAL CARCINOMA IN SITU, NUCLEAR GRADE 2,  . Cesarean section  1997, 2012  . Bladder surgery  1974  . Uterine fibroid surgery  2009    BP 122/93 mmHg  Pulse 100  Temp(Src) 98.2 F (36.8 C) (Tympanic)  Resp 16  Ht 5\' 8"  (1.727 m)  Wt 83.915 kg (185 lb)  BMI 28.14 kg/m2  SpO2 100%  LMP 11/04/2015  NAD RRR no murmurs CTAB, no wheezing, resps unlabored +BS, soft, NTTP No c/c/e Pelvic exam deferred  The above history was confirmed with the patient. The condition still exists that makes this procedure necessary. Surgical plan includes dilation and curettage, hysteroscopy, as confirmed on the consent. The treatment plan remains the same, without new options for care.  The patient understands the potential benefits and risks and the consents have been signed and placed on the chart.     Larey Days, MD Attending Obstetrician Gynecologist [facility]

## 2015-11-20 NOTE — Discharge Instructions (Signed)
You should expect to have some cramping and vaginal bleeding for about a week. This should taper off and subside, much like a period. If heavy bleeding continues or gets worse, you should contact the office for an earlier appointment.   Please call the office or physician on call for fever >101, severe pain, and heavy bleeding.   [place]!!  Please speak to your oncologist about hormonal treatments available for you, and whether they will effect your uterus and/or ovaries.     AMBULATORY SURGERY  DISCHARGE INSTRUCTIONS   1) The drugs that you were given will stay in your system until tomorrow so for the next 24 hours you should not:  A) Drive an automobile B) Make any legal decisions C) Drink any alcoholic beverage   2) You may resume regular meals tomorrow.  Today it is better to start with liquids and gradually work up to solid foods.  You may eat anything you prefer, but it is better to start with liquids, then soup and crackers, and gradually work up to solid foods.   3) Please notify your doctor immediately if you have any unusual bleeding, trouble breathing, redness and pain at the surgery site, drainage, fever, or pain not relieved by medication.    4) Additional Instructions:     Please contact your physician with any problems or Same Day Surgery at [phone], Monday through Friday 6 am to 4 pm, or [place] at [facility] number at [phone].

## 2015-11-20 NOTE — Anesthesia Preprocedure Evaluation (Signed)
Anesthesia Evaluation  Patient identified by MRN, date of birth, ID band Patient awake    Reviewed: Allergy & Precautions, NPO status , Patient's Chart, lab work & pertinent test results  History of Anesthesia Complications Negative for: history of anesthetic complications  Airway Mallampati: II       Dental  (+) Implants   Pulmonary neg pulmonary ROS, former smoker,           Cardiovascular negative cardio ROS       Neuro/Psych negative neurological ROS  negative psych ROS   GI/Hepatic negative GI ROS, Neg liver ROS, GERD  Medicated and Poorly Controlled,  Endo/Other  negative endocrine ROSdiabetes (borderline)Hyperthyroidism   Renal/GU negative Renal ROS     Musculoskeletal   Abdominal   Peds  Hematology   Anesthesia Other Findings   Reproductive/Obstetrics                             Anesthesia Physical Anesthesia Plan  ASA: II  Anesthesia Plan: General   Post-op Pain Management:    Induction: Intravenous  Airway Management Planned: Oral ETT  Additional Equipment:   Intra-op Plan:   Post-operative Plan:   Informed Consent: I have reviewed the patients History and Physical, chart, labs and discussed the procedure including the risks, benefits and alternatives for the proposed anesthesia with the patient or authorized representative who has indicated his/her understanding and acceptance.     Plan Discussed with:   Anesthesia Plan Comments:         Anesthesia Quick Evaluation

## 2015-11-23 ENCOUNTER — Encounter: Payer: Self-pay | Admitting: Obstetrics & Gynecology

## 2015-11-24 ENCOUNTER — Encounter: Admission: RE | Payer: Self-pay | Source: Ambulatory Visit

## 2015-11-24 ENCOUNTER — Ambulatory Visit
Admission: RE | Admit: 2015-11-24 | Payer: BLUE CROSS/BLUE SHIELD | Source: Ambulatory Visit | Admitting: General Surgery

## 2015-11-24 ENCOUNTER — Ambulatory Visit: Payer: BLUE CROSS/BLUE SHIELD

## 2015-11-24 LAB — SURGICAL PATHOLOGY

## 2015-11-24 SURGERY — MASTECTOMY WITH SENTINEL LYMPH NODE BIOPSY
Anesthesia: Choice | Laterality: Left

## 2015-12-24 ENCOUNTER — Encounter: Payer: Self-pay | Admitting: *Deleted

## 2015-12-24 NOTE — Progress Notes (Signed)
  Oncology Nurse Navigator Documentation  Navigator Location: [facility] Onc (12/24/15 0900) Navigator Encounter Type: Telephone (12/24/15 0900)               Barriers/Navigation Needs: No barriers at this time (12/24/15 0900)                          Time Spent with Patient: 15 (12/24/15 0900)   Called patient today.  She had cancelled her surgery scheduled for this month.  States she is going to the [facility] in [place].  States she is scheduled for surgery later this month.  Offered support.

## 2016-05-10 ENCOUNTER — Telehealth: Payer: Self-pay | Admitting: *Deleted

## 2016-05-10 NOTE — Telephone Encounter (Signed)
Talked to patient today.  She is doing well.  States she had a mastectomy, and 5 weeks of radiation therapy at the [facility] in [place].  States she is now on Tamoxifen and tolerating it well.  She is waiting on reconstructive surgery in upcoming year.  She is going to send me a copy of her final path report for our records.  She is to call if she has any questions or needs.

## 2019-01-07 ENCOUNTER — Encounter: Payer: Self-pay | Admitting: *Deleted

## 2019-01-07 NOTE — Progress Notes (Signed)
  Oncology Nurse Navigator Documentation  Navigator Location: [facility] (01/07/19 0800) Referral date to RadOnc/MedOnc: 01/09/19 (01/07/19 0800) )Navigator Encounter Type: Telephone (01/07/19 0800) Telephone: [facility] Call (01/07/19 0800)                       Barriers/Navigation Needs: Coordination of Care (01/07/19 0800)   Interventions: Coordination of Care (01/07/19 0800)   Coordination of Care: Appts (01/07/19 0800)                  Time Spent with Patient: 30 (01/07/19 0800)   Patient called last Thursday and wanted to set up a free mammogram.  She also wants to establish medical oncology.  She was diagnosed here with breast cancer in 2017, and went to the [facility] for her treatment.  She states they have released her and encouraged her to continue follow up with an oncologist near home.  The patient lives in [place] and wants to come here for her follow-up and next mammogram.  She is currently uninsured, but does not meet BCCCP eligibility. We discussed as a patient in our facility we could get a free mammogram through our [employer]. I have scheduled her a medical oncology appointment for 01/09/19 @ 10:45 with Dr. Tasia Catchings.  I have called her back to give her the appointment, but no answer and no voicemail.  Will try back again later.

## 2019-01-09 ENCOUNTER — Encounter (INDEPENDENT_AMBULATORY_CARE_PROVIDER_SITE_OTHER): Payer: Self-pay

## 2019-01-09 ENCOUNTER — Other Ambulatory Visit: Payer: Self-pay

## 2019-01-09 ENCOUNTER — Encounter: Payer: Self-pay | Admitting: Oncology

## 2019-01-09 ENCOUNTER — Inpatient Hospital Stay: Payer: Medicaid - Out of State

## 2019-01-09 ENCOUNTER — Inpatient Hospital Stay: Payer: Medicaid - Out of State | Attending: Oncology | Admitting: Oncology

## 2019-01-09 VITALS — BP 118/80 | HR 102 | Temp 97.7°F | Wt 188.0 lb

## 2019-01-09 DIAGNOSIS — Z853 Personal history of malignant neoplasm of breast: Secondary | ICD-10-CM | POA: Diagnosis present

## 2019-01-09 DIAGNOSIS — Z87891 Personal history of nicotine dependence: Secondary | ICD-10-CM

## 2019-01-09 DIAGNOSIS — F32A Depression, unspecified: Secondary | ICD-10-CM

## 2019-01-09 DIAGNOSIS — F329 Major depressive disorder, single episode, unspecified: Secondary | ICD-10-CM | POA: Diagnosis not present

## 2019-01-09 DIAGNOSIS — C50212 Malignant neoplasm of upper-inner quadrant of left female breast: Secondary | ICD-10-CM | POA: Diagnosis not present

## 2019-01-09 DIAGNOSIS — F419 Anxiety disorder, unspecified: Secondary | ICD-10-CM | POA: Diagnosis not present

## 2019-01-09 DIAGNOSIS — Z9012 Acquired absence of left breast and nipple: Secondary | ICD-10-CM | POA: Diagnosis not present

## 2019-01-09 DIAGNOSIS — Z923 Personal history of irradiation: Secondary | ICD-10-CM

## 2019-01-09 DIAGNOSIS — Z79810 Long term (current) use of selective estrogen receptor modulators (SERMs): Secondary | ICD-10-CM

## 2019-01-09 LAB — COMPREHENSIVE METABOLIC PANEL
ALT: 19 U/L (ref 0–44)
AST: 19 U/L (ref 15–41)
Albumin: 4 g/dL (ref 3.5–5.0)
Alkaline Phosphatase: 62 U/L (ref 38–126)
Anion gap: 8 (ref 5–15)
BUN: 15 mg/dL (ref 6–20)
CO2: 22 mmol/L (ref 22–32)
Calcium: 9 mg/dL (ref 8.9–10.3)
Chloride: 109 mmol/L (ref 98–111)
Creatinine, Ser: 0.81 mg/dL (ref 0.44–1.00)
GFR calc Af Amer: >60 mL/min (ref >60)
GFR calc non Af Amer: >60 mL/min (ref >60)
Glucose, Bld: 115 mg/dL — ABNORMAL HIGH (ref 70–99)
Potassium: 4.1 mmol/L (ref 3.5–5.1)
Sodium: 139 mmol/L (ref 135–145)
Total Bilirubin: 0.4 mg/dL (ref 0.3–1.2)
Total Protein: 7.3 g/dL (ref 6.5–8.1)

## 2019-01-09 LAB — CBC WITH DIFFERENTIAL/PLATELET
Abs Immature Granulocytes: 0.03 10*3/uL (ref 0.00–0.07)
Basophils Absolute: 0.1 10*3/uL (ref 0.0–0.1)
Basophils Relative: 1 %
Eosinophils Absolute: 0.5 10*3/uL (ref 0.0–0.5)
Eosinophils Relative: 7 %
HCT: 39.4 % (ref 36.0–46.0)
Hemoglobin: 12.9 g/dL (ref 12.0–15.0)
Immature Granulocytes: 0 %
Lymphocytes Relative: 31 %
Lymphs Abs: 2.4 10*3/uL (ref 0.7–4.0)
MCH: 27.2 pg (ref 26.0–34.0)
MCHC: 32.7 g/dL (ref 30.0–36.0)
MCV: 83.1 fL (ref 80.0–100.0)
Monocytes Absolute: 0.4 10*3/uL (ref 0.1–1.0)
Monocytes Relative: 6 %
Neutro Abs: 4.2 10*3/uL (ref 1.7–7.7)
Neutrophils Relative %: 55 %
Platelets: 255 10*3/uL (ref 150–400)
RBC: 4.74 MIL/uL (ref 3.87–5.11)
RDW: 13.1 % (ref 11.5–15.5)
WBC: 7.6 10*3/uL (ref 4.0–10.5)
nRBC: 0 % (ref 0.0–0.2)

## 2019-01-09 MED ORDER — TAMOXIFEN CITRATE 20 MG PO TABS: 20.0000 mg | ORAL_TABLET | Freq: Every day | ORAL | 0 refills | Status: DC

## 2019-01-09 MED ORDER — SUMATRIPTAN SUCCINATE 100 MG PO TABS: 100.0000 mg | ORAL_TABLET | ORAL | 0 refills | Status: AC | PRN

## 2019-01-09 MED ORDER — VENLAFAXINE HCL ER 75 MG PO CP24: 75.0000 mg | ORAL_CAPSULE | Freq: Every day | ORAL | 0 refills | Status: DC

## 2019-01-09 MED ORDER — TOPIRAMATE 25 MG PO TABS: 50.0000 mg | ORAL_TABLET | Freq: Two times a day (BID) | ORAL | 0 refills | Status: DC

## 2019-01-09 NOTE — Progress Notes (Signed)
Patient here today as a new patient.  Patient has history of left breast cancer.

## 2019-01-10 LAB — ESTRADIOL: Estradiol: 9.9 pg/mL

## 2019-01-10 LAB — FOLLICLE STIMULATING HORMONE: FSH: 33.8 m[IU]/mL

## 2019-01-12 NOTE — Progress Notes (Signed)
Hematology/Oncology Consult note [facility] Telephone:([phone] [phone]   Patient Care Team: Arnetha Gula, MD as PCP - General (Family Medicine) Arnetha Gula, MD (Family Medicine)  REFERRING PROVIDER: Arnetha Gula, MD  CHIEF COMPLAINTS/REASON FOR VISIT:  Establish care for breast cancer.   HISTORY OF PRESENTING ILLNESS:  Alexandra Byrd is a  53 y.o.  female with PMH listed below was seen in consultation at the request of  Arnetha Gula, MD  for evaluation of history of breast cancer.  Patient reports that she had breast cancer diagnosed in 2017 and was treated at cancer treatments centers of [place].   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 There is an associated suspicious mass in the 2 o'clock position of the left breast worrisome for an invasive component of disease. In total, the suspicious microcalcifications span approximately 9.5 x 7.9 x 8.5 cm. No evidence of left axillary lymphadenopathy.No evidence of malignancy in the right breast.  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  Patient then went to outside facility for treatment.   I obtained patient's medical records from cancer treatment centers of [place]. Extensive medical records review was performed by me. 01/19/2016 left mastectomy pathology invasive mammary carcinoma, Nottingham grade 2 out of 3, 2.1 cm in greatest dimension, extensive DCIS, multiple foci.  Intermediate to high-grade with comedonecrosis and a micro-calcification.  Inked surgical resection margins negative for invasive carcinoma.  Closest surgical margin less than 0.1 cm posterior.  DCIS present at inked anterior surgical resection H and upper outer quadrant.  Skin/nipple with DCIS present in  deep dermis.  2 left sentinel lymph nodes negative for carcinoma.  pT2pN0  Per Dr.Pabbathi Haritha oncology note, MammaPrint low risk luminal a type,Per physician note, IDC ER PR positive HER-2 negative [path report stated hormone receptor status was sent out to [facility].  I don't have the reports available at the time of dictation]  Patient underwent adjuvant radiation completed 04/11/2016. Started on adjuvant tamoxifen 20 mg daily since 03/2016.  #Uterine leiomyoma Jan 12, 2016 ultrasound pelvis transvaginal showed the uterus demonstrates suspected fibroid changes and is mildly enlarged and heterogeneous.  The endometrial stripe complex is not well visualized but measures less than a centimeter in thickness.  No dominant adnexal lesions. 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 She requests me to refill some of there medications.   Review of Systems  Constitutional: Negative for appetite change, chills, fatigue and fever.  HENT:   Negative for hearing loss and voice change.   Eyes: Negative for eye problems.  Respiratory: Negative for chest tightness and cough.   Cardiovascular: Negative for chest pain.  Gastrointestinal: Negative for abdominal distention, abdominal pain and blood in stool.  Endocrine: Negative for hot flashes.  Genitourinary: Negative for difficulty urinating and frequency.   Musculoskeletal: Negative for arthralgias.  Skin: Negative for itching and rash.  Neurological: Negative for extremity weakness.  Hematological: Negative for adenopathy.  Psychiatric/Behavioral: Negative for confusion.    MEDICAL HISTORY:  Past Medical History:  Diagnosis Date  . Anxiety   . Cancer [facility]) 11-06-15   left breast  . Depression   . EBV infection   . GERD (  gastroesophageal reflux disease)   . Hashimoto's thyroiditis     SURGICAL  HISTORY: Past Surgical History:  Procedure Laterality Date  . BLADDER SURGERY  1974  . BREAST BIOPSY Left 11-06-15   DUCTAL CARCINOMA IN SITU, NUCLEAR GRADE 2,  . [place], 2012  . HYSTEROSCOPY W/D&C N/A 11/20/2015   Procedure: DILATATION AND CURETTAGE /HYSTEROSCOPY;  Surgeon: Honor Loh Ward, MD;  Location: [facility] ORS;  Service: Gynecology;  Laterality: N/A;  . UTERINE FIBROID SURGERY  2009    SOCIAL HISTORY: Social History   Socioeconomic History  . Marital status: Married    Spouse name: Not on file  . Number of children: Not on file  . Years of education: Not on file  . Highest education level: Not on file  Occupational History  . Not on file  Social Needs  . Financial resource strain: Not on file  . Food insecurity:    Worry: Not on file    Inability: Not on file  . Transportation needs:    Medical: Not on file    Non-medical: Not on file  Tobacco Use  . Smoking status: Former Smoker    Years: 5.00    Types: Cigarettes    Last attempt to quit: 2009    Years since quitting: 11.3  . Smokeless tobacco: Never Used  Substance and Sexual Activity  . Alcohol use: Never    Alcohol/week: 0.0 standard drinks    Frequency: Never  . Drug use: Not Currently  . Sexual activity: Not on file  Lifestyle  . Physical activity:    Days per week: Not on file    Minutes per session: Not on file  . Stress: Not on file  Relationships  . Social connections:    Talks on phone: Not on file    Gets together: Not on file    Attends religious service: Not on file    Active member of club or organization: Not on file    Attends meetings of clubs or organizations: Not on file    Relationship status: Not on file  . Intimate partner violence:    Fear of current or ex partner: Not on file    Emotionally abused: Not on file    Physically abused: Not on file    Forced sexual activity: Not on file  Other Topics Concern  . Not on file  Social History Narrative  . Not on file     FAMILY HISTORY: Family History  Problem Relation Age of Onset  . Cancer Mother        thyroid/throat  . Prostate cancer Paternal Uncle   . Leukemia Paternal Grandfather     ALLERGIES:  is allergic to penicillins.  MEDICATIONS:  Current Outpatient Medications  Medication Sig Dispense Refill  . ARMOUR THYROID 60 MG tablet Take 90 mg by mouth daily before breakfast.   2  . Cholecalciferol (VITAMIN D3) 5000 units CAPS Take 50,000 Units by mouth.     . SUMAtriptan (IMITREX) 100 MG tablet Take 1 tablet (100 mg total) by mouth as needed. 10 tablet 0  . tamoxifen (NOLVADEX) 20 MG tablet Take 1 tablet (20 mg total) by mouth daily. 30 tablet 0  . topiramate (TOPAMAX) 25 MG tablet Take 2 tablets (50 mg total) by mouth 2 (two) times daily. 60 tablet 0  . venlafaxine XR (EFFEXOR-XR) 75 MG 24 hr capsule Take 1 capsule (75 mg total) by mouth daily. 30 capsule 0   No current facility-administered medications  for this visit.      PHYSICAL EXAMINATION: ECOG PERFORMANCE STATUS: 0 - Asymptomatic Vitals:   01/21/19 1706  BP: 118/80  Pulse: (!) 102  Temp: 97.7 F (36.5 C)   Filed Weights   01/21/19 1706  Weight: 188 lb (85.3 kg)    Physical Exam Constitutional:      General: She is not in acute distress. HENT:     Head: Normocephalic and atraumatic.  Eyes:     General: No scleral icterus.    Pupils: Pupils are equal, round, and reactive to light.  Neck:     Musculoskeletal: Normal range of motion and neck supple.  Cardiovascular:     Rate and Rhythm: Normal rate and regular rhythm.     Heart sounds: Normal heart sounds.  Pulmonary:     Effort: Pulmonary effort is normal. No respiratory distress.     Breath sounds: No wheezing.  Abdominal:     General: Bowel sounds are normal. There is no distension.     Palpations: Abdomen is soft. There is no mass.     Tenderness: There is no abdominal tenderness.  Musculoskeletal: Normal range of motion.        General: No deformity.   Skin:    General: Skin is warm and dry.     Findings: No erythema or rash.  Neurological:     Mental Status: She is alert and oriented to person, place, and time.     Cranial Nerves: No cranial nerve deficit.     Coordination: Coordination normal.  Psychiatric:        Behavior: Behavior normal.        Thought Content: Thought content normal.   Breast exam is performed in seated and lying down position. Patient is status post left mastectomy  no evidence of any chest wall recurrence. No evidence of bilateral axillary adenopathy     LABORATORY DATA:  I have reviewed the data as listed Lab Results  Component Value Date   WBC 7.6 01/09/2019   HGB 12.9 01/09/2019   HCT 39.4 01/09/2019   MCV 83.1 01/09/2019   PLT 255 01/09/2019   Recent Labs    01/09/19 1159  NA 139  K 4.1  CL 109  CO2 22  GLUCOSE 115*  BUN 15  CREATININE 0.81  CALCIUM 9.0  GFRNONAA >60  GFRAA >60  PROT 7.3  ALBUMIN 4.0  AST 19  ALT 19  ALKPHOS 62  BILITOT 0.4   Iron/TIBC/Ferritin/ %Sat No results found for: IRON, TIBC, FERRITIN, IRONPCTSAT    RADIOGRAPHIC STUDIES: I have personally reviewed the radiological images as listed and agreed with the findings in the report.  No results found.    ASSESSMENT & PLAN:  1. History of breast cancer   2. Depression, unspecified depression type   3. Anxiety    Reviewed patient's oncology treatment history  History of pT2pN0 left breast IDC s/p left mastectomy and adjuvant radiation.  Reported to be ER PR positive HER-2 negative Currently on adjuvant Tamoxifen.  Recommend annual right screening diagnostic mammogram.  Check cbc cmp estradiol, FSH.  Continue Tamoxifen, refill sent to pharmacy.   Patient requests me to refill her chronic medication since until she gets appt with pcp  Discussed with patient that I will only refill one month supply    Orders Placed This Encounter  Procedures  . CBC with Differential/Platelet    Standing Status:    Future    Number of Occurrences:   1  Standing Expiration Date:   01/09/2020  . Comprehensive metabolic panel    Standing Status:   Future    Number of Occurrences:   1    Standing Expiration Date:   01/09/2020  . Estradiol    Standing Status:   Future    Number of Occurrences:   1    Standing Expiration Date:   01/09/2020  . Follicle Stimulating Hormone    Standing Status:   Future    Number of Occurrences:   1    Standing Expiration Date:   01/09/2020    All questions were answered. The patient knows to call the clinic with any problems questions or concerns.  cc Arnetha Gula, MD    Return of visit: 6 months.  Thank you for this kind referral and the opportunity to participate in the care of this patient. A copy of today's note is routed to referring provider  Total face to face encounter time for this patient visit was 60  min. >50% of the time was  spent in counseling and coordination of care.    [name], MD, PhD Hematology Oncology [facility] Pager- [phone] 01/12/2019

## 2019-01-20 ENCOUNTER — Encounter: Payer: Self-pay | Admitting: Oncology

## 2019-01-21 ENCOUNTER — Other Ambulatory Visit: Payer: Self-pay

## 2019-01-21 DIAGNOSIS — Z853 Personal history of malignant neoplasm of breast: Secondary | ICD-10-CM

## 2019-01-23 ENCOUNTER — Telehealth: Payer: Self-pay | Admitting: *Deleted

## 2019-01-23 NOTE — Telephone Encounter (Signed)
Per Benjamine Mola 01/21/19 Scheduling Message to arrange patient to have mammogram.  Crystal, from [facility] stated that pt had a mastectomy in [place] in 2017 and that they would  need for her to come in the office to sign release form before they could get her scheduled. Patient was made aware of this on 6/1.Marland KitchenMarland KitchenA secure chat message was sent to make Dr. Tasia Catchings & team aware. I reached out to her again on 6/3 and she stated that she hadn't made it to [facility] to sign form as of yet.

## 2019-01-30 ENCOUNTER — Telehealth: Payer: Self-pay | Admitting: *Deleted

## 2019-01-30 NOTE — Telephone Encounter (Signed)
Per Lenna Sciara from [facility] stated that they are still waiting on [facility] to send them the imaging reports.  They last spoke with them on 01/28/19 but hasn't received anything as of yet.  She said that [facility] has to have them before patient can be scheduled for a mammo.

## 2019-02-11 ENCOUNTER — Other Ambulatory Visit: Payer: Self-pay | Admitting: Oncology

## 2019-02-11 DIAGNOSIS — Z1231 Encounter for screening mammogram for malignant neoplasm of breast: Secondary | ICD-10-CM

## 2019-02-14 ENCOUNTER — Ambulatory Visit
Admission: RE | Admit: 2019-02-14 | Discharge: 2019-02-14 | Disposition: A | Discharge status: Home / Self Care | Payer: Medicaid - Out of State | Source: Ambulatory Visit | Attending: Oncology | Admitting: Oncology

## 2019-02-14 ENCOUNTER — Other Ambulatory Visit: Payer: Self-pay

## 2019-02-14 DIAGNOSIS — Z1231 Encounter for screening mammogram for malignant neoplasm of breast: Secondary | ICD-10-CM | POA: Diagnosis present

## 2019-03-11 ENCOUNTER — Other Ambulatory Visit: Payer: Self-pay | Admitting: *Deleted

## 2019-03-13 ENCOUNTER — Other Ambulatory Visit: Payer: Self-pay | Admitting: Oncology

## 2019-03-13 ENCOUNTER — Telehealth: Payer: Self-pay | Admitting: *Deleted

## 2019-03-13 MED ORDER — TAMOXIFEN CITRATE 20 MG PO TABS: 20.0000 mg | ORAL_TABLET | Freq: Every day | ORAL | 5 refills | Status: DC

## 2019-03-13 NOTE — Telephone Encounter (Signed)
Called pt to let her know tamoxifen has been sent in and she states that she has to have refill venlafaxine due to her hot flashes. I spoke to [place] and she states it is fine and I faxed refill to her pharmacy and let pt know both will be refilled

## 2019-03-14 MED ORDER — VENLAFAXINE HCL ER 75 MG PO CP24: 75.0000 mg | ORAL_CAPSULE | Freq: Every day | ORAL | 2 refills | Status: DC

## 2019-05-31 ENCOUNTER — Other Ambulatory Visit: Payer: Self-pay | Admitting: Oncology

## 2019-07-11 ENCOUNTER — Other Ambulatory Visit: Payer: Self-pay | Admitting: Oncology

## 2019-07-25 ENCOUNTER — Other Ambulatory Visit: Payer: Self-pay

## 2019-07-25 ENCOUNTER — Inpatient Hospital Stay: Payer: Medicaid - Out of State | Attending: Oncology

## 2019-07-25 DIAGNOSIS — Z170 Estrogen receptor positive status [ER+]: Secondary | ICD-10-CM | POA: Insufficient documentation

## 2019-07-25 DIAGNOSIS — C50919 Malignant neoplasm of unspecified site of unspecified female breast: Secondary | ICD-10-CM | POA: Insufficient documentation

## 2019-07-25 DIAGNOSIS — Z79899 Other long term (current) drug therapy: Secondary | ICD-10-CM | POA: Diagnosis not present

## 2019-07-25 DIAGNOSIS — Z79810 Long term (current) use of selective estrogen receptor modulators (SERMs): Secondary | ICD-10-CM | POA: Insufficient documentation

## 2019-07-25 DIAGNOSIS — Z853 Personal history of malignant neoplasm of breast: Secondary | ICD-10-CM

## 2019-07-25 LAB — COMPREHENSIVE METABOLIC PANEL
ALT: 38 U/L (ref 0–44)
AST: 36 U/L (ref 15–41)
Albumin: 4.1 g/dL (ref 3.5–5.0)
Alkaline Phosphatase: 66 U/L (ref 38–126)
Anion gap: 8 (ref 5–15)
BUN: 12 mg/dL (ref 6–20)
CO2: 25 mmol/L (ref 22–32)
Calcium: 9.7 mg/dL (ref 8.9–10.3)
Chloride: 105 mmol/L (ref 98–111)
Creatinine, Ser: 0.86 mg/dL (ref 0.44–1.00)
GFR calc Af Amer: >60 mL/min (ref >60)
GFR calc non Af Amer: >60 mL/min (ref >60)
Glucose, Bld: 100 mg/dL — ABNORMAL HIGH (ref 70–99)
Potassium: 4 mmol/L (ref 3.5–5.1)
Sodium: 138 mmol/L (ref 135–145)
Total Bilirubin: 0.5 mg/dL (ref 0.3–1.2)
Total Protein: 7.6 g/dL (ref 6.5–8.1)

## 2019-07-25 LAB — CBC WITH DIFFERENTIAL/PLATELET
Abs Immature Granulocytes: 0.03 10*3/uL (ref 0.00–0.07)
Basophils Absolute: 0.1 10*3/uL (ref 0.0–0.1)
Basophils Relative: 1 %
Eosinophils Absolute: 0.4 10*3/uL (ref 0.0–0.5)
Eosinophils Relative: 5 %
HCT: 40.2 % (ref 36.0–46.0)
Hemoglobin: 12.9 g/dL (ref 12.0–15.0)
Immature Granulocytes: 0 %
Lymphocytes Relative: 29 %
Lymphs Abs: 2.7 10*3/uL (ref 0.7–4.0)
MCH: 28.1 pg (ref 26.0–34.0)
MCHC: 32.1 g/dL (ref 30.0–36.0)
MCV: 87.6 fL (ref 80.0–100.0)
Monocytes Absolute: 0.7 10*3/uL (ref 0.1–1.0)
Monocytes Relative: 8 %
Neutro Abs: 5.2 10*3/uL (ref 1.7–7.7)
Neutrophils Relative %: 57 %
Platelets: 279 10*3/uL (ref 150–400)
RBC: 4.59 MIL/uL (ref 3.87–5.11)
RDW: 12.5 % (ref 11.5–15.5)
WBC: 9.2 10*3/uL (ref 4.0–10.5)
nRBC: 0 % (ref 0.0–0.2)

## 2019-07-29 ENCOUNTER — Other Ambulatory Visit: Payer: Medicaid - Out of State

## 2019-07-29 ENCOUNTER — Encounter: Payer: Self-pay | Admitting: Oncology

## 2019-07-29 ENCOUNTER — Inpatient Hospital Stay (HOSPITAL_BASED_OUTPATIENT_CLINIC_OR_DEPARTMENT_OTHER): Payer: Medicaid - Out of State | Admitting: Oncology

## 2019-07-29 DIAGNOSIS — Z853 Personal history of malignant neoplasm of breast: Secondary | ICD-10-CM

## 2019-07-29 DIAGNOSIS — H269 Unspecified cataract: Secondary | ICD-10-CM | POA: Diagnosis not present

## 2019-07-29 DIAGNOSIS — Z79810 Long term (current) use of selective estrogen receptor modulators (SERMs): Secondary | ICD-10-CM | POA: Diagnosis not present

## 2019-07-29 NOTE — Progress Notes (Signed)
Patient verified using two identifiers for virtual visit via telephone today.  Patient would like to know if taking the Tamoxifen could be related to new cataracts diagnosis.

## 2019-07-29 NOTE — Progress Notes (Signed)
HEMATOLOGY-ONCOLOGY TeleHEALTH VISIT PROGRESS NOTE  I connected with Alexandra Byrd on 07/29/19 at 10:30 AM EST by video enabled telemedicine visit and verified that I am speaking with the correct person using two identifiers. I discussed the limitations, risks, security and privacy concerns of performing an evaluation and management service by telemedicine and the availability of in-person appointments. I also discussed with the patient that there may be a patient responsible charge related to this service. The patient expressed understanding and agreed to proceed.   Other persons participating in the visit and their role in the encounter:  None  Patient's location: Home  Provider's location: office Chief Complaint: Follow-up for breast cancer   INTERVAL HISTORY Alexandra Byrd is a 53 y.o. female who has above history reviewed by me today presents for follow up visit for management of  breast cancer Problems and complaints are listed below:  Patient has been on tamoxifen.  Tolerates well. She has some concerns about if tamoxifen causes cataracts.  Recently she was diagnosed with cataracts of both eyes. Otherwise no new complaints.  Review of Systems  Constitutional: Negative for appetite change, chills, fatigue and fever.  HENT:   Negative for hearing loss and voice change.   Eyes:       Cataracts  Respiratory: Negative for chest tightness and cough.   Cardiovascular: Negative for chest pain.  Gastrointestinal: Negative for abdominal distention, abdominal pain and blood in stool.  Endocrine: Negative for hot flashes.  Genitourinary: Negative for difficulty urinating and frequency.   Musculoskeletal: Negative for arthralgias.  Skin: Negative for itching and rash.  Neurological: Negative for extremity weakness.  Hematological: Negative for adenopathy.  Psychiatric/Behavioral: Negative for confusion.    Past Medical History:  Diagnosis Date  . Anxiety   . Cancer [facility]) 11-06-15    left breast  . Depression   . EBV infection   . GERD (gastroesophageal reflux disease)   . Hashimoto's thyroiditis    Past Surgical History:  Procedure Laterality Date  . BLADDER SURGERY  1974  . BREAST BIOPSY Left 11-06-15   DUCTAL CARCINOMA IN SITU, NUCLEAR GRADE 2,  . [place], 2012  . HYSTEROSCOPY W/D&C N/A 11/20/2015   Procedure: DILATATION AND CURETTAGE /HYSTEROSCOPY;  Surgeon: Honor Loh Ward, MD;  Location: [facility] ORS;  Service: Gynecology;  Laterality: N/A;  . MASTECTOMY Left 2017  . UTERINE FIBROID SURGERY  2009    Family History  Problem Relation Age of Onset  . Cancer Mother        thyroid/throat  . Prostate cancer Paternal Uncle   . Leukemia Paternal Grandfather   . Breast cancer Neg Hx     Social History   Socioeconomic History  . Marital status: Married    Spouse name: Not on file  . Number of children: Not on file  . Years of education: Not on file  . Highest education level: Not on file  Occupational History  . Not on file  Social Needs  . Financial resource strain: Not on file  . Food insecurity    Worry: Not on file    Inability: Not on file  . Transportation needs    Medical: Not on file    Non-medical: Not on file  Tobacco Use  . Smoking status: Former Smoker    Years: 5.00    Types: Cigarettes    Quit date: 2009    Years since quitting: 11.9  . Smokeless tobacco: Never Used  Substance and Sexual Activity  .  Alcohol use: Never    Alcohol/week: 0.0 standard drinks    Frequency: Never  . Drug use: Not Currently  . Sexual activity: Not on file  Lifestyle  . Physical activity    Days per week: Not on file    Minutes per session: Not on file  . Stress: Not on file  Relationships  . Social Herbalist on phone: Not on file    Gets together: Not on file    Attends religious service: Not on file    Active member of club or organization: Not on file    Attends meetings of clubs or organizations: Not on file     Relationship status: Not on file  . Intimate partner violence    Fear of current or ex partner: Not on file    Emotionally abused: Not on file    Physically abused: Not on file    Forced sexual activity: Not on file  Other Topics Concern  . Not on file  Social History Narrative  . Not on file    Current Outpatient Medications on File Prior to Visit  Medication Sig Dispense Refill  . ARMOUR THYROID 60 MG tablet Take 90 mg by mouth daily before breakfast.   2  . Cholecalciferol (VITAMIN D3) 5000 units CAPS Take 50,000 Units by mouth.     . SUMAtriptan (IMITREX) 100 MG tablet Take 1 tablet (100 mg total) by mouth as needed. 10 tablet 0  . tamoxifen (NOLVADEX) 20 MG tablet TAKE ONE TABLET BY MOUTH DAILY 30 tablet 0  . venlafaxine XR (EFFEXOR-XR) 75 MG 24 hr capsule Take 1 capsule (75 mg total) by mouth daily. 30 capsule 2  . topiramate (TOPAMAX) 25 MG tablet Take 2 tablets (50 mg total) by mouth 2 (two) times daily. (Patient not taking: Reported on 07/29/2019) 60 tablet 0   No current facility-administered medications on file prior to visit.     Allergies  Allergen Reactions  . Penicillins Rash       Observations/Objective: Today's Vitals   07/29/19 1003  PainSc: 0-No pain   There is no height or weight on file to calculate BMI.  Physical Exam  CBC    Component Value Date/Time   WBC 9.2 07/25/2019 1355   RBC 4.59 07/25/2019 1355   HGB 12.9 07/25/2019 1355   HCT 40.2 07/25/2019 1355   PLT 279 07/25/2019 1355   MCV 87.6 07/25/2019 1355   MCH 28.1 07/25/2019 1355   MCHC 32.1 07/25/2019 1355   RDW 12.5 07/25/2019 1355   LYMPHSABS 2.7 07/25/2019 1355   MONOABS 0.7 07/25/2019 1355   EOSABS 0.4 07/25/2019 1355   BASOSABS 0.1 07/25/2019 1355    CMP     Component Value Date/Time   NA 138 07/25/2019 1355   K 4.0 07/25/2019 1355   CL 105 07/25/2019 1355   CO2 25 07/25/2019 1355   GLUCOSE 100 (H) 07/25/2019 1355   BUN 12 07/25/2019 1355   CREATININE 0.86 07/25/2019 1355    CALCIUM 9.7 07/25/2019 1355   PROT 7.6 07/25/2019 1355   ALBUMIN 4.1 07/25/2019 1355   AST 36 07/25/2019 1355   ALT 38 07/25/2019 1355   ALKPHOS 66 07/25/2019 1355   BILITOT 0.5 07/25/2019 1355   GFRNONAA >60 07/25/2019 1355   GFRAA >60 07/25/2019 1355     Assessment and Plan: 1. History of breast cancer   2. Long-term current use of tamoxifen   3. 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  Reported to be ER/PR positive and HER-2 negative .  Currently on adjuvant tamoxifen.  Recommend continue annual right screening mammogram. Continue tamoxifen.  Discussed with patient about total of 10 years of antiestrogen treatments. Patient had a hysterectomy in 2017.  At that time he was having heavy menstrual bleeding.  Patient is most likely still in perimenopausal state.  Discussed with her that in the future when she developed menopause, will switch to aromatase inhibitor.  Cataracts, discussed with patient that cataract is associated with tamoxifen use cataract extraction with intraocular lens placement can be considered. Discussed with patient about taking ophthalmologist evaluation and management.   Follow Up Instructions: 6 months   I discussed the assessment and treatment plan with the patient. The patient was provided an opportunity to ask questions and all were answered. The patient agreed with the plan and demonstrated an understanding of the instructions.  The patient was advised to call back or seek an in-person evaluation if the symptoms worsen or if the condition fails to improve as anticipated.   I provided 15 minutes of face-to-face video visit time during this encounter, and > 50% was spent counseling as documented under my assessment & plan.  [name], MD 07/29/2019 8:30 PM

## 2019-08-31 ENCOUNTER — Other Ambulatory Visit: Payer: Self-pay | Admitting: Oncology

## 2019-09-27 IMAGING — MG DIGITAL SCREENING UNILATERAL RIGHT MAMMOGRAM WITH CAD AND TOMO
4 series · 4 of 12 positions shown · non-contrast
Comparison: Previous exam(s).

CLINICAL DATA: Screening.

EXAM:
DIGITAL SCREENING UNILATERAL RIGHT MAMMOGRAM WITH CAD AND TOMO

[R MLO synth-2D]
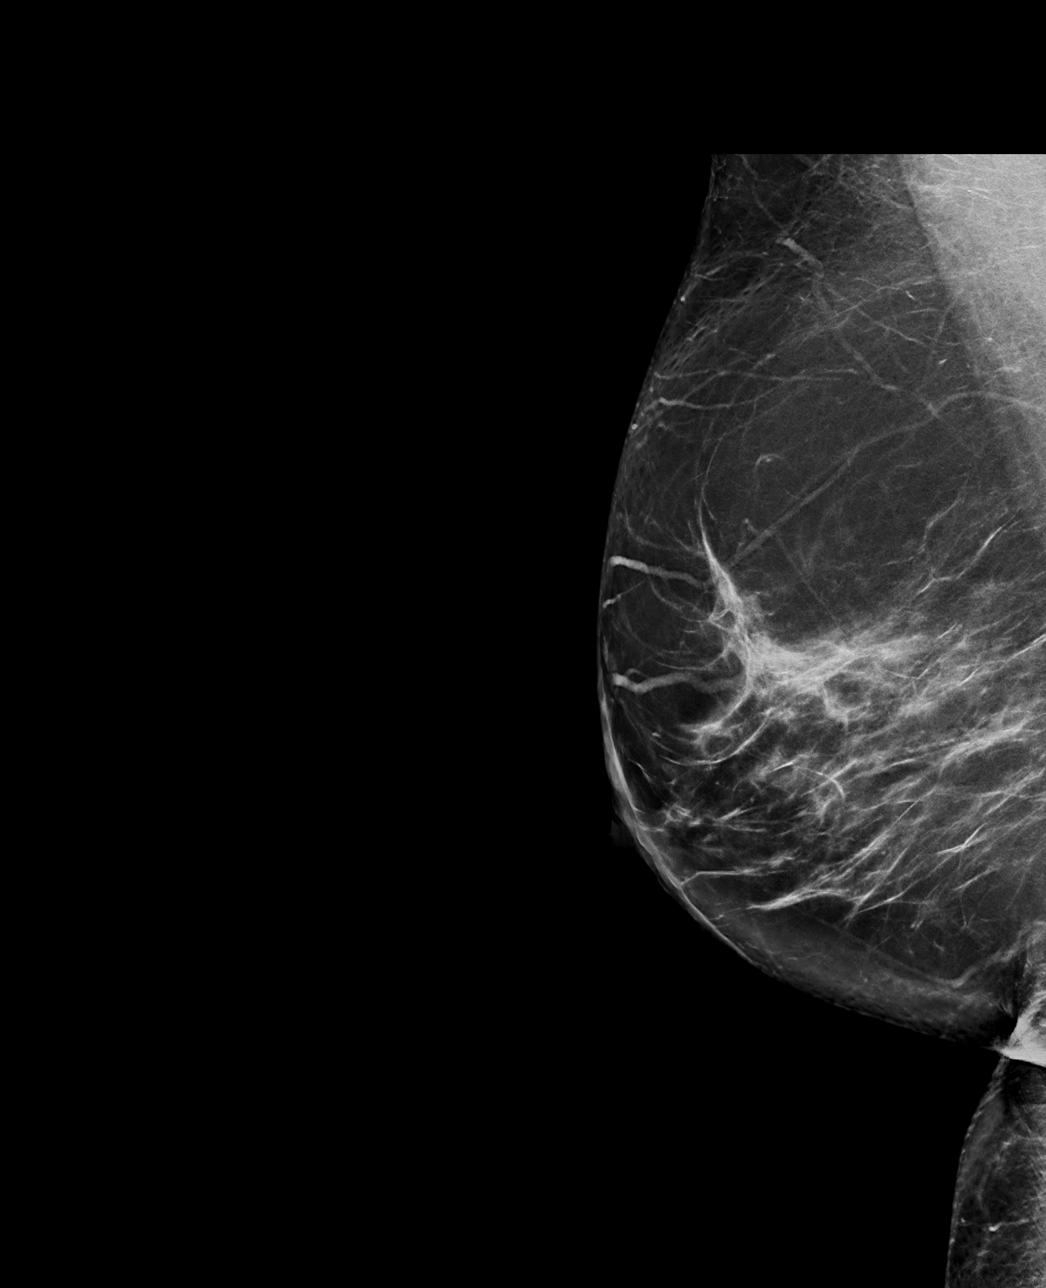

[R CC synth-2D]
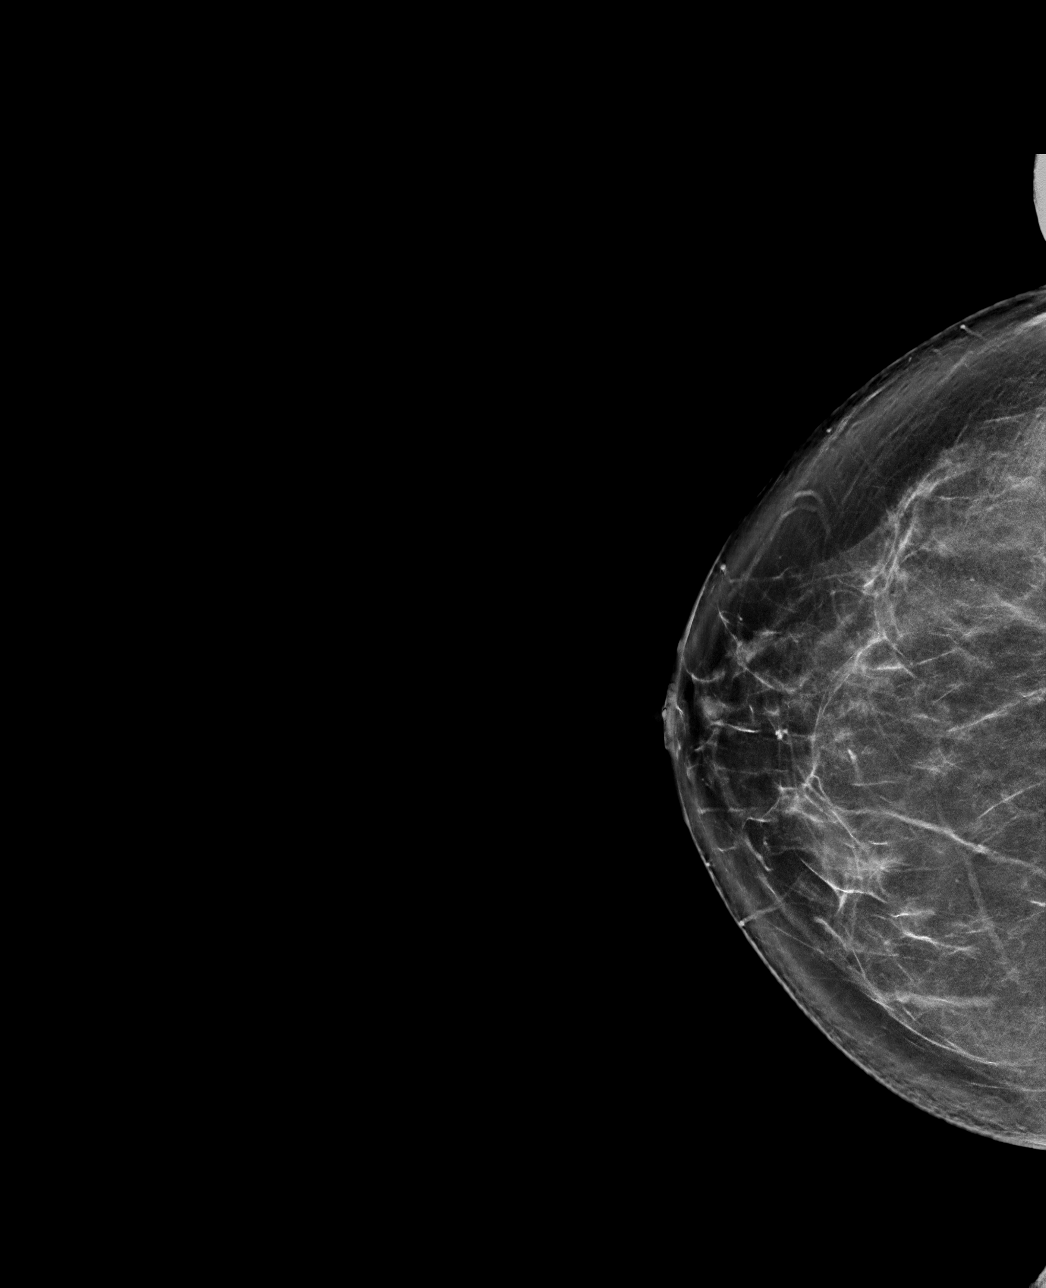

[R MLO tomo · tomo slice 43/84.0]
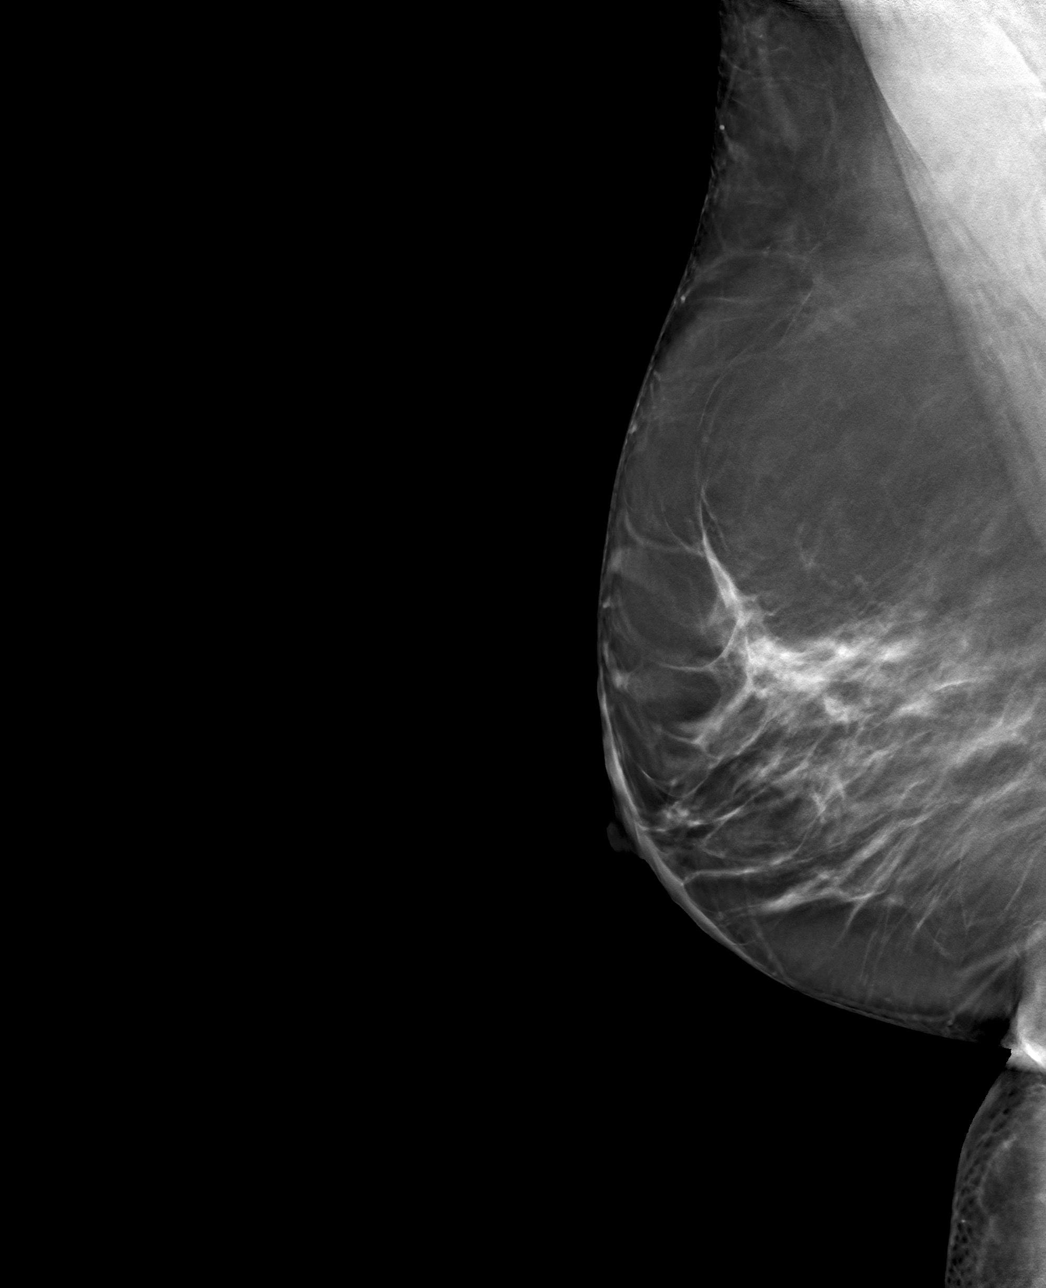

[R CC tomo · tomo slice 42/83.0]
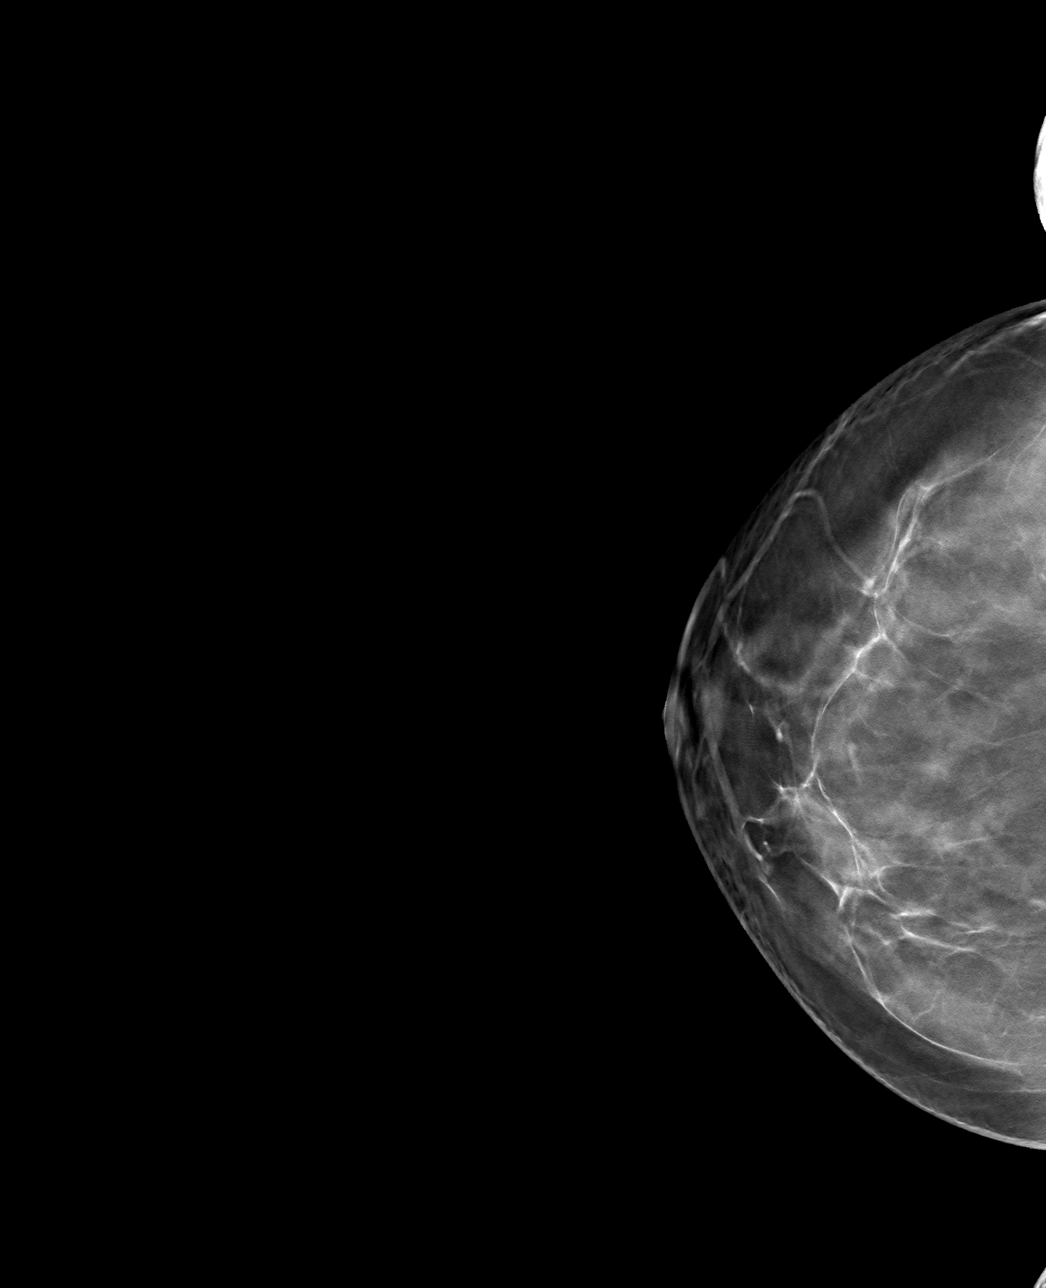

[4 of 12 positions shown; findings below may reference images not displayed]

ACR Breast Density Category c: The breast tissue is heterogeneously
dense, which may obscure small masses.
FINDINGS: The patient has had a left mastectomy. There are no findings
suspicious for malignancy. Images were processed with CAD.
IMPRESSION: No mammographic evidence of malignancy. A result letter of this
screening mammogram will be mailed directly to the patient.

RECOMMENDATION:
Screening mammogram in one year.  (Code:CY-P-D4Y)

BI-RADS CATEGORY  1: Negative.

## 2019-10-15 ENCOUNTER — Other Ambulatory Visit: Payer: Self-pay | Admitting: Oncology

## 2020-01-01 ENCOUNTER — Other Ambulatory Visit: Payer: Self-pay | Admitting: Oncology

## 2020-02-17 ENCOUNTER — Ambulatory Visit
Admission: RE | Admit: 2020-02-17 | Discharge: 2020-02-17 | Disposition: A | Discharge status: Home / Self Care | Payer: Medicaid - Out of State | Source: Ambulatory Visit | Attending: Oncology | Admitting: Oncology

## 2020-02-17 DIAGNOSIS — Z9012 Acquired absence of left breast and nipple: Secondary | ICD-10-CM | POA: Insufficient documentation

## 2020-02-17 DIAGNOSIS — Z853 Personal history of malignant neoplasm of breast: Secondary | ICD-10-CM | POA: Insufficient documentation

## 2020-02-17 DIAGNOSIS — Z1231 Encounter for screening mammogram for malignant neoplasm of breast: Secondary | ICD-10-CM | POA: Insufficient documentation

## 2020-02-17 HISTORY — DX: Malignant neoplasm of unspecified site of unspecified female breast: C50.919

## 2020-02-25 ENCOUNTER — Inpatient Hospital Stay: Payer: Medicaid - Out of State | Attending: Oncology

## 2020-02-25 ENCOUNTER — Inpatient Hospital Stay (HOSPITAL_BASED_OUTPATIENT_CLINIC_OR_DEPARTMENT_OTHER): Payer: Medicaid - Out of State | Admitting: Oncology

## 2020-02-25 ENCOUNTER — Other Ambulatory Visit: Payer: Self-pay

## 2020-02-25 ENCOUNTER — Encounter: Payer: Self-pay | Admitting: Oncology

## 2020-02-25 VITALS — BP 113/79 | HR 98 | Temp 98.2°F | Resp 18 | Wt 203.0 lb

## 2020-02-25 DIAGNOSIS — Z853 Personal history of malignant neoplasm of breast: Secondary | ICD-10-CM | POA: Diagnosis not present

## 2020-02-25 DIAGNOSIS — K219 Gastro-esophageal reflux disease without esophagitis: Secondary | ICD-10-CM | POA: Insufficient documentation

## 2020-02-25 DIAGNOSIS — Z79899 Other long term (current) drug therapy: Secondary | ICD-10-CM | POA: Diagnosis not present

## 2020-02-25 DIAGNOSIS — Z923 Personal history of irradiation: Secondary | ICD-10-CM | POA: Diagnosis not present

## 2020-02-25 DIAGNOSIS — Z8042 Family history of malignant neoplasm of prostate: Secondary | ICD-10-CM | POA: Insufficient documentation

## 2020-02-25 DIAGNOSIS — Z806 Family history of leukemia: Secondary | ICD-10-CM | POA: Diagnosis not present

## 2020-02-25 DIAGNOSIS — Z79810 Long term (current) use of selective estrogen receptor modulators (SERMs): Secondary | ICD-10-CM | POA: Insufficient documentation

## 2020-02-25 DIAGNOSIS — Z87891 Personal history of nicotine dependence: Secondary | ICD-10-CM | POA: Insufficient documentation

## 2020-02-25 DIAGNOSIS — C50412 Malignant neoplasm of upper-outer quadrant of left female breast: Secondary | ICD-10-CM | POA: Insufficient documentation

## 2020-02-25 DIAGNOSIS — Z9012 Acquired absence of left breast and nipple: Secondary | ICD-10-CM | POA: Diagnosis not present

## 2020-02-25 DIAGNOSIS — Z170 Estrogen receptor positive status [ER+]: Secondary | ICD-10-CM | POA: Insufficient documentation

## 2020-02-25 DIAGNOSIS — F329 Major depressive disorder, single episode, unspecified: Secondary | ICD-10-CM | POA: Insufficient documentation

## 2020-02-25 DIAGNOSIS — L918 Other hypertrophic disorders of the skin: Secondary | ICD-10-CM | POA: Diagnosis not present

## 2020-02-25 DIAGNOSIS — F419 Anxiety disorder, unspecified: Secondary | ICD-10-CM | POA: Diagnosis not present

## 2020-02-25 LAB — CBC WITH DIFFERENTIAL/PLATELET
Abs Immature Granulocytes: 0.03 10*3/uL (ref 0.00–0.07)
Basophils Absolute: 0.1 10*3/uL (ref 0.0–0.1)
Basophils Relative: 1 %
Eosinophils Absolute: 0.4 10*3/uL (ref 0.0–0.5)
Eosinophils Relative: 4 %
HCT: 37.4 % (ref 36.0–46.0)
Hemoglobin: 12.8 g/dL (ref 12.0–15.0)
Immature Granulocytes: 0 %
Lymphocytes Relative: 29 %
Lymphs Abs: 2.6 10*3/uL (ref 0.7–4.0)
MCH: 28.9 pg (ref 26.0–34.0)
MCHC: 34.2 g/dL (ref 30.0–36.0)
MCV: 84.4 fL (ref 80.0–100.0)
Monocytes Absolute: 0.6 10*3/uL (ref 0.1–1.0)
Monocytes Relative: 7 %
Neutro Abs: 5.1 10*3/uL (ref 1.7–7.7)
Neutrophils Relative %: 59 %
Platelets: 253 10*3/uL (ref 150–400)
RBC: 4.43 MIL/uL (ref 3.87–5.11)
RDW: 12.8 % (ref 11.5–15.5)
WBC: 8.8 10*3/uL (ref 4.0–10.5)
nRBC: 0 % (ref 0.0–0.2)

## 2020-02-25 LAB — COMPREHENSIVE METABOLIC PANEL
ALT: 33 U/L (ref 0–44)
AST: 25 U/L (ref 15–41)
Albumin: 3.7 g/dL (ref 3.5–5.0)
Alkaline Phosphatase: 69 U/L (ref 38–126)
Anion gap: 8 (ref 5–15)
BUN: 15 mg/dL (ref 6–20)
CO2: 26 mmol/L (ref 22–32)
Calcium: 8.8 mg/dL — ABNORMAL LOW (ref 8.9–10.3)
Chloride: 107 mmol/L (ref 98–111)
Creatinine, Ser: 0.78 mg/dL (ref 0.44–1.00)
GFR calc Af Amer: >60 mL/min (ref >60)
GFR calc non Af Amer: >60 mL/min (ref >60)
Glucose, Bld: 136 mg/dL — ABNORMAL HIGH (ref 70–99)
Potassium: 4.2 mmol/L (ref 3.5–5.1)
Sodium: 141 mmol/L (ref 135–145)
Total Bilirubin: 0.3 mg/dL (ref 0.3–1.2)
Total Protein: 7.2 g/dL (ref 6.5–8.1)

## 2020-02-25 NOTE — Progress Notes (Signed)
Hematology/Oncology Consult note [facility] Telephone:([phone] [phone]   Patient Care Team: Arnetha Gula, MD as PCP - General (Family Medicine) Arnetha Gula, MD (Family Medicine)  REFERRING PROVIDER: Arnetha Gula, MD  CHIEF COMPLAINTS/REASON FOR VISIT:  Establish care for breast cancer.   HISTORY OF PRESENTING ILLNESS:  Alexandra Byrd is a  54 y.o.  female with PMH listed below was seen in consultation at the request of  Arnetha Gula, MD  for evaluation of history of breast cancer.  Patient reports that she had breast cancer diagnosed in 2017 and was treated at cancer treatments centers of [place].   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 There is an associated suspicious mass in the 2 o'clock position of the left breast worrisome for an invasive component of disease. In total, the suspicious microcalcifications span approximately 9.5 x 7.9 x 8.5 cm. No evidence of left axillary lymphadenopathy.No evidence of malignancy in the right breast.  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  Patient then went to outside facility for treatment.   I obtained patient's medical records from cancer treatment centers [facility] [place]. Extensive medical records review was performed by me. 01/19/2016 left mastectomy pathology invasive mammary carcinoma, Nottingham grade 2 out of 3, 2.1 cm in greatest dimension, extensive DCIS, multiple foci.  Intermediate to high-grade with comedonecrosis and a micro-calcification.  Inked surgical resection margins negative for invasive carcinoma.  Closest surgical margin less than 0.1 cm posterior.  DCIS present at inked anterior surgical resection H and upper outer quadrant.  Skin/nipple with DCIS present in  deep dermis.  2 left sentinel lymph nodes negative for carcinoma.  pT2pN0  Per Dr.Pabbathi Haritha oncology note, MammaPrint low risk luminal a type,Per physician note, IDC ER PR positive HER-2 negative [path report stated hormone receptor status was sent out to [facility].  I don't have the reports available at the time of dictation]  Patient underwent adjuvant radiation completed 04/11/2016. Started on adjuvant tamoxifen 20 mg daily since 03/2016.  #Uterine leiomyoma Jan 12, 2016 ultrasound pelvis transvaginal showed the uterus demonstrates suspected fibroid changes and is mildly enlarged and heterogeneous.  The endometrial stripe complex is not well visualized but measures less than a centimeter in thickness.  No dominant adnexal lesions. 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 She requests me to refill some of there medications.    INTERVAL HISTORY Alexandra Byrd is a 54 y.o. female who has above history reviewed by me today presents for follow up visit for Breast cancer  Problems and complaints are listed below:  Patient has been on tamoxifen Since 2017.  She also has had cataracts surgery done. She has a new skin tag on the inner side of left upper leg close to groin area.  She took a picture and showed me. Skin lesion has no pain.  Review of Systems  Constitutional: Negative for appetite change, chills, fatigue and fever.  HENT:   Negative for hearing loss and voice change.   Eyes: Negative for eye problems.  Respiratory: Negative for chest tightness and cough.   Cardiovascular: Negative for chest pain.  Gastrointestinal: Negative for abdominal distention, abdominal pain and blood  in stool.  Endocrine: Negative for hot flashes.  Genitourinary: Negative for difficulty urinating and frequency.   Musculoskeletal: Negative for arthralgias.   Skin: Negative for itching and rash.  Neurological: Negative for extremity weakness.  Hematological: Negative for adenopathy.  Psychiatric/Behavioral: Negative for confusion.    MEDICAL HISTORY:  Past Medical History:  Diagnosis Date  . Anxiety   . Breast cancer ([place])   . Cancer [facility]) 11-06-15   left breast  . Depression   . EBV infection   . GERD (gastroesophageal reflux disease)   . Hashimoto's thyroiditis     SURGICAL HISTORY: Past Surgical History:  Procedure Laterality Date  . BLADDER SURGERY  1974  . BREAST BIOPSY Left 11-06-15   DUCTAL CARCINOMA IN SITU, NUCLEAR GRADE 2,  . [place], 2012  . HYSTEROSCOPY WITH D & C N/A 11/20/2015   Procedure: DILATATION AND CURETTAGE /HYSTEROSCOPY;  Surgeon: Honor Loh Ward, MD;  Location: [facility] ORS;  Service: Gynecology;  Laterality: N/A;  . MASTECTOMY Left 2017  . UTERINE FIBROID SURGERY  2009    SOCIAL HISTORY: Social History   Socioeconomic History  . Marital status: Married    Spouse name: Not on file  . Number of children: Not on file  . Years of education: Not on file  . Highest education level: Not on file  Occupational History  . Not on file  Tobacco Use  . Smoking status: Former Smoker    Years: 5.00    Types: Cigarettes    Quit date: 2009    Years since quitting: 12.5  . Smokeless tobacco: Never Used  Vaping Use  . Vaping Use: Never used  Substance and Sexual Activity  . Alcohol use: Never    Alcohol/week: 0.0 standard drinks  . Drug use: Not Currently  . Sexual activity: Not on file  Other Topics Concern  . Not on file  Social History Narrative  . Not on file   Social Determinants of Health   Financial Resource Strain:   . Difficulty of Paying Living Expenses:   Food Insecurity:   . Worried About Charity fundraiser in the Last Year:   . Arboriculturist in the Last Year:   Transportation Needs:   . Film/video editor (Medical):   Marland Kitchen Lack of Transportation (Non-Medical):    Physical Activity:   . Days of Exercise per Week:   . Minutes of Exercise per Session:   Stress:   . Feeling of Stress :   Social Connections:   . Frequency of Communication with Friends and Family:   . Frequency of Social Gatherings with Friends and Family:   . Attends Religious Services:   . Active Member of Clubs or Organizations:   . Attends Archivist Meetings:   Marland Kitchen Marital Status:   Intimate Partner Violence:   . Fear of Current or Ex-Partner:   . Emotionally Abused:   Marland Kitchen Physically Abused:   . Sexually Abused:     FAMILY HISTORY: Family History  Problem Relation Age of Onset  . Cancer Mother        thyroid/throat  . Prostate cancer Paternal Uncle   . Leukemia Paternal Grandfather   . Breast cancer Neg Hx     ALLERGIES:  is allergic to penicillins.  MEDICATIONS:  Current Outpatient Medications  Medication Sig Dispense Refill  . ARMOUR THYROID 60 MG tablet Take 90 mg by mouth daily before breakfast.   2  . Cholecalciferol (VITAMIN D3) 5000  units CAPS Take 50,000 Units by mouth.     . SUMAtriptan (IMITREX) 100 MG tablet Take 1 tablet (100 mg total) by mouth as needed. 10 tablet 0  . tamoxifen (NOLVADEX) 20 MG tablet TAKE ONE TABLET BY MOUTH DAILY 30 tablet 3  . venlafaxine XR (EFFEXOR-XR) 75 MG 24 hr capsule TAKE ONE CAPSULE BY MOUTH EVERY DAY 30 capsule 2   No current facility-administered medications for this visit.     PHYSICAL EXAMINATION: ECOG PERFORMANCE STATUS: 0 - Asymptomatic Vitals:   02/25/20 1405  BP: 113/79  Pulse: 98  Resp: 18  Temp: 98.2 F (36.8 C)   Filed Weights   02/25/20 1405  Weight: 203 lb (92.1 kg)    Physical Exam Constitutional:      General: She is not in acute distress. HENT:     Head: Normocephalic and atraumatic.  Eyes:     General: No scleral icterus.    Pupils: Pupils are equal, round, and reactive to light.  Cardiovascular:     Rate and Rhythm: Normal rate and regular rhythm.     Heart sounds: Normal  heart sounds.  Pulmonary:     Effort: Pulmonary effort is normal. No respiratory distress.     Breath sounds: No wheezing.  Abdominal:     General: Bowel sounds are normal. There is no distension.     Palpations: Abdomen is soft. There is no mass.     Tenderness: There is no abdominal tenderness.  Musculoskeletal:        General: No deformity. Normal range of motion.     Cervical back: Normal range of motion and neck supple.  Skin:    General: Skin is warm and dry.     Findings: No erythema or rash.     Comments: Left inner thigh skin tag  Neurological:     Mental Status: She is alert and oriented to person, place, and time. Mental status is at baseline.     Cranial Nerves: No cranial nerve deficit.     Coordination: Coordination normal.  Psychiatric:        Mood and Affect: Mood normal.        Behavior: Behavior normal.        Thought Content: Thought content normal.   Breast exam is performed in seated and lying down position. Patient is status post left mastectomy  no evidence of any chest wall recurrence. No evidence of bilateral axillary adenopathy     LABORATORY DATA:  I have reviewed the data as listed Lab Results  Component Value Date   WBC 8.8 02/25/2020   HGB 12.8 02/25/2020   HCT 37.4 02/25/2020   MCV 84.4 02/25/2020   PLT 253 02/25/2020   Recent Labs    07/25/19 1355 02/25/20 1341  NA 138 141  K 4.0 4.2  CL 105 107  CO2 25 26  GLUCOSE 100* 136*  BUN 12 15  CREATININE 0.86 0.78  CALCIUM 9.7 8.8*  GFRNONAA >60 >60  GFRAA >60 >60  PROT 7.6 7.2  ALBUMIN 4.1 3.7  AST 36 25  ALT 38 33  ALKPHOS 66 69  BILITOT 0.5 0.3   Iron/TIBC/Ferritin/ %Sat No results found for: IRON, TIBC, FERRITIN, IRONPCTSAT    RADIOGRAPHIC STUDIES: I have personally reviewed the radiological images as listed and agreed with the findings in the report.  MM 3D SCREEN BREAST UNI RIGHT  Result Date: 02/18/2020 CLINICAL DATA:  Screening. EXAM: DIGITAL SCREENING UNILATERAL  RIGHT MAMMOGRAM WITH CAD AND TOMO  COMPARISON:  Previous exam(s). ACR Breast Density Category c: The breast tissue is heterogeneously dense, which may obscure small masses. FINDINGS: The patient has had a left mastectomy. There are no findings suspicious for malignancy. Images were processed with CAD. IMPRESSION: No mammographic evidence of malignancy. A result letter of this screening mammogram will be mailed directly to the patient. RECOMMENDATION: Screening mammogram in one year.  (Code:SM-R-71M) BI-RADS CATEGORY  1: Negative. Electronically Signed   By: Kristopher Oppenheim M.D.   On: 02/18/2020 14:23      ASSESSMENT & PLAN:  1. History of breast cancer   2. Long-term current use of tamoxifen   3. Skin tag   4. Hypocalcemia    History of pT2pN0 left breast IDC s/p left mastectomy and adjuvant radiation.  Reported to be ER PR positive HER-2 negative Labs reviewed and discussed with patient.  Continue adjuvant tamoxifen.  We discussed about total 10 years if she tolerates treatments 02/17/2020 unilateral right screening mammogram showed no mammographic evidence of malignancy.-Right mammogram due in 1 year. Mild hypocalcemia, discussed with patient and I recommend patient to start oral calcium supplementation. Skin tag, anticipate to resolve.  If not I recommend patient to contact PCP for dermatology for further evaluation.    Orders Placed This Encounter  Procedures  . Comprehensive metabolic panel    Standing Status:   Future    Standing Expiration Date:   02/24/2021  . CBC with Differential/Platelet    Standing Status:   Future    Standing Expiration Date:   02/24/2021    All questions were answered. The patient knows to call the clinic with any problems questions or concerns.   Return of visit: 6 months.  [name], MD, PhD Hematology Oncology [facility] Pager- [phone] 02/25/2020

## 2020-02-25 NOTE — Progress Notes (Signed)
Patient here for follow up. No new breast problems. Pt reports new skin tag to left upper leg, close to groin area.

## 2020-04-01 ENCOUNTER — Other Ambulatory Visit: Payer: Self-pay | Admitting: Oncology

## 2020-05-01 ENCOUNTER — Encounter: Payer: Self-pay | Admitting: Oncology

## 2020-07-01 ENCOUNTER — Other Ambulatory Visit: Payer: Self-pay | Admitting: Oncology

## 2020-08-31 ENCOUNTER — Inpatient Hospital Stay: Payer: Medicaid - Out of State | Attending: Oncology

## 2020-08-31 ENCOUNTER — Inpatient Hospital Stay (HOSPITAL_BASED_OUTPATIENT_CLINIC_OR_DEPARTMENT_OTHER): Payer: Medicaid - Out of State | Admitting: Oncology

## 2020-08-31 ENCOUNTER — Encounter: Payer: Self-pay | Admitting: Oncology

## 2020-08-31 ENCOUNTER — Other Ambulatory Visit: Payer: Self-pay

## 2020-08-31 VITALS — BP 120/84 | HR 102 | Temp 98.8°F | Resp 18 | Wt 205.0 lb

## 2020-08-31 DIAGNOSIS — Z806 Family history of leukemia: Secondary | ICD-10-CM | POA: Insufficient documentation

## 2020-08-31 DIAGNOSIS — Z853 Personal history of malignant neoplasm of breast: Secondary | ICD-10-CM

## 2020-08-31 DIAGNOSIS — D0512 Intraductal carcinoma in situ of left breast: Secondary | ICD-10-CM | POA: Diagnosis not present

## 2020-08-31 DIAGNOSIS — Z170 Estrogen receptor positive status [ER+]: Secondary | ICD-10-CM | POA: Diagnosis not present

## 2020-08-31 DIAGNOSIS — Z79810 Long term (current) use of selective estrogen receptor modulators (SERMs): Secondary | ICD-10-CM

## 2020-08-31 DIAGNOSIS — Z9012 Acquired absence of left breast and nipple: Secondary | ICD-10-CM | POA: Insufficient documentation

## 2020-08-31 DIAGNOSIS — Z808 Family history of malignant neoplasm of other organs or systems: Secondary | ICD-10-CM | POA: Diagnosis not present

## 2020-08-31 DIAGNOSIS — Z87891 Personal history of nicotine dependence: Secondary | ICD-10-CM | POA: Insufficient documentation

## 2020-08-31 LAB — COMPREHENSIVE METABOLIC PANEL
ALT: 41 U/L (ref 0–44)
AST: 37 U/L (ref 15–41)
Albumin: 4.3 g/dL (ref 3.5–5.0)
Alkaline Phosphatase: 56 U/L (ref 38–126)
Anion gap: 10 (ref 5–15)
BUN: 19 mg/dL (ref 6–20)
CO2: 22 mmol/L (ref 22–32)
Calcium: 9.5 mg/dL (ref 8.9–10.3)
Chloride: 107 mmol/L (ref 98–111)
Creatinine, Ser: 0.96 mg/dL (ref 0.44–1.00)
GFR, Estimated: >60 mL/min (ref >60)
Glucose, Bld: 107 mg/dL — ABNORMAL HIGH (ref 70–99)
Potassium: 4.2 mmol/L (ref 3.5–5.1)
Sodium: 139 mmol/L (ref 135–145)
Total Bilirubin: 0.4 mg/dL (ref 0.3–1.2)
Total Protein: 7.6 g/dL (ref 6.5–8.1)

## 2020-08-31 LAB — CBC WITH DIFFERENTIAL/PLATELET
Abs Immature Granulocytes: 0.02 10*3/uL (ref 0.00–0.07)
Basophils Absolute: 0.1 10*3/uL (ref 0.0–0.1)
Basophils Relative: 1 %
Eosinophils Absolute: 0.4 10*3/uL (ref 0.0–0.5)
Eosinophils Relative: 5 %
HCT: 38.8 % (ref 36.0–46.0)
Hemoglobin: 13.4 g/dL (ref 12.0–15.0)
Immature Granulocytes: 0 %
Lymphocytes Relative: 38 %
Lymphs Abs: 2.7 10*3/uL (ref 0.7–4.0)
MCH: 29.9 pg (ref 26.0–34.0)
MCHC: 34.5 g/dL (ref 30.0–36.0)
MCV: 86.6 fL (ref 80.0–100.0)
Monocytes Absolute: 0.5 10*3/uL (ref 0.1–1.0)
Monocytes Relative: 7 %
Neutro Abs: 3.6 10*3/uL (ref 1.7–7.7)
Neutrophils Relative %: 49 %
Platelets: 289 10*3/uL (ref 150–400)
RBC: 4.48 MIL/uL (ref 3.87–5.11)
RDW: 13.2 % (ref 11.5–15.5)
WBC: 7.3 10*3/uL (ref 4.0–10.5)
nRBC: 0 % (ref 0.0–0.2)

## 2020-08-31 MED ORDER — VENLAFAXINE HCL ER 75 MG PO CP24: 75.0000 mg | ORAL_CAPSULE | Freq: Every day | ORAL | 1 refills | Status: DC

## 2020-08-31 MED ORDER — TAMOXIFEN CITRATE 20 MG PO TABS: 20.0000 mg | ORAL_TABLET | Freq: Every day | ORAL | 1 refills | Status: DC

## 2020-08-31 NOTE — Progress Notes (Signed)
Hematology/Oncology follow-up note [facility] Telephone:(336) [phone] Fax:(336) [phone]   Patient Care Team: Arnetha Gula, MD as PCP - General (Family Medicine) Arnetha Gula, MD (Family Medicine)  REFERRING PROVIDER: Arnetha Gula, MD  CHIEF COMPLAINTS/REASON FOR VISIT:  Follow-up for breast cancer.   HISTORY OF PRESENTING ILLNESS:  Alexandra Byrd is a  55 y.o.  female with PMH listed below was seen in consultation at the request of  Arnetha Gula, MD  for evaluation of history of breast cancer.  Patient reports that she had breast cancer diagnosed in 2017 and was treated at cancer treatments centers of [place].   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 There is an associated suspicious mass in the 2 o'clock position of the left breast worrisome for an invasive component of disease. In total, the suspicious microcalcifications span approximately 9.5 x 7.9 x 8.5 cm. No evidence of left axillary lymphadenopathy.No evidence of malignancy in the right breast.  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  Patient then went to outside facility for treatment.   I obtained patient's medical records from cancer treatment centers of [place]. Extensive medical records review was performed by me. 01/19/2016 left mastectomy pathology invasive mammary carcinoma, Nottingham grade 2 out of 3, 2.1 cm in greatest dimension, extensive DCIS, multiple foci.  Intermediate to high-grade with comedonecrosis and a micro-calcification.  Inked surgical resection margins negative for invasive carcinoma.  Closest surgical margin less than 0.1 cm posterior.  DCIS present at inked anterior surgical resection H and upper outer quadrant.  Skin/nipple with DCIS present in deep  dermis.  2 left sentinel lymph nodes negative for carcinoma.  pT2pN0  Per Dr.Pabbathi Haritha oncology note, MammaPrint low risk luminal a type,Per physician note, IDC ER PR positive HER-2 negative [path report stated hormone receptor status was sent out to [facility] laboratory.  I don't have the reports available at the time of dictation]  Patient underwent adjuvant radiation completed 04/11/2016. Started on adjuvant tamoxifen 20 mg daily since 03/2016.  #Uterine leiomyoma Jan 12, 2016 ultrasound pelvis transvaginal showed the uterus demonstrates suspected fibroid changes and is mildly enlarged and heterogeneous.  The endometrial stripe complex is not well visualized but measures less than a centimeter in thickness.  No dominant adnexal lesions. 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 She requests me to refill some of there medications.   # 02/17/2020 unilateral right screening mammogram showed no mammographic evidence of malignancy.- INTERVAL HISTORY Alexandra Byrd is a 55 y.o. female who has above history reviewed by me today presents for follow up visit for Breast cancer  Problems and complaints are listed below:  Patient has been on tamoxifen Since 2017.   She tolerates with some difficulties.  She takes Effexor with some symptom control. No new complaints.  Intermittently she feels some tenderness/discomfort around her left implant.  No fever, chills, erythema, swelling.   Review of Systems  Constitutional: Negative for appetite change, chills, fatigue and fever.  HENT:   Negative for hearing loss and voice change.   Eyes: Negative for eye problems.  Respiratory: Negative for chest tightness and cough.   Cardiovascular: Negative for chest pain.  Gastrointestinal: Negative for  abdominal distention, abdominal pain and blood in stool.  Endocrine: Negative  for hot flashes.  Genitourinary: Negative for difficulty urinating and frequency.   Musculoskeletal: Negative for arthralgias.  Skin: Negative for itching and rash.  Neurological: Negative for extremity weakness.  Hematological: Negative for adenopathy.  Psychiatric/Behavioral: Negative for confusion.    MEDICAL HISTORY:  Past Medical History:  Diagnosis Date  . Anxiety   . Breast cancer ([place])   . Cancer [facility]) 11-06-15   left breast  . Depression   . EBV infection   . GERD (gastroesophageal reflux disease)   . Hashimoto's thyroiditis     SURGICAL HISTORY: Past Surgical History:  Procedure Laterality Date  . BLADDER SURGERY  1974  . BREAST BIOPSY Left 11-06-15   DUCTAL CARCINOMA IN SITU, NUCLEAR GRADE 2,  . [place], 2012  . HYSTEROSCOPY WITH D & C N/A 11/20/2015   Procedure: DILATATION AND CURETTAGE /HYSTEROSCOPY;  Surgeon: Honor Loh Ward, MD;  Location: [facility] ORS;  Service: Gynecology;  Laterality: N/A;  . MASTECTOMY Left 2017  . UTERINE FIBROID SURGERY  2009    SOCIAL HISTORY: Social History   Socioeconomic History  . Marital status: Married    Spouse name: Not on file  . Number of children: Not on file  . Years of education: Not on file  . Highest education level: Not on file  Occupational History  . Not on file  Tobacco Use  . Smoking status: Former Smoker    Years: 5.00    Types: Cigarettes    Quit date: 2009    Years since quitting: 13.0  . Smokeless tobacco: Never Used  Vaping Use  . Vaping Use: Never used  Substance and Sexual Activity  . Alcohol use: Never    Alcohol/week: 0.0 standard drinks  . Drug use: Not Currently  . Sexual activity: Not on file  Other Topics Concern  . Not on file  Social History Narrative  . Not on file   Social Determinants of Health   Financial Resource Strain: Not on file  Food Insecurity: Not on file  Transportation Needs: Not on file  Physical Activity: Not on file  Stress: Not on file  Social  Connections: Not on file  Intimate Partner Violence: Not on file    FAMILY HISTORY: Family History  Problem Relation Age of Onset  . Cancer Mother        thyroid/throat  . Prostate cancer Paternal Uncle   . Leukemia Paternal Grandfather   . Breast cancer Neg Hx     ALLERGIES:  is allergic to penicillins.  MEDICATIONS:  Current Outpatient Medications  Medication Sig Dispense Refill  . ARMOUR THYROID 60 MG tablet Take 90 mg by mouth daily before breakfast.   2  . Cholecalciferol (VITAMIN D3) 5000 units CAPS Take 50,000 Units by mouth.     . SUMAtriptan (IMITREX) 100 MG tablet Take 1 tablet (100 mg total) by mouth as needed. 10 tablet 0  . tamoxifen (NOLVADEX) 20 MG tablet TAKE ONE TABLET BY MOUTH DAILY 30 tablet 3  . venlafaxine XR (EFFEXOR-XR) 75 MG 24 hr capsule TAKE ONE CAPSULE BY MOUTH EVERY DAY 30 capsule 2   No current facility-administered medications for this visit.     PHYSICAL EXAMINATION: ECOG PERFORMANCE STATUS: 0 - Asymptomatic Vitals:   08/31/20 1330  BP: 120/84  Pulse: (!) 102  Resp: 18  Temp: 98.8 F (37.1 C)   Filed Weights   08/31/20 1330  Weight: 205 lb (93  kg)    Physical Exam Constitutional:      General: She is not in acute distress. HENT:     Head: Normocephalic and atraumatic.  Eyes:     General: No scleral icterus.    Pupils: Pupils are equal, round, and reactive to light.  Cardiovascular:     Rate and Rhythm: Normal rate and regular rhythm.     Heart sounds: Normal heart sounds.  Pulmonary:     Effort: Pulmonary effort is normal. No respiratory distress.     Breath sounds: No wheezing.  Abdominal:     General: Bowel sounds are normal. There is no distension.     Palpations: Abdomen is soft. There is no mass.     Tenderness: There is no abdominal tenderness.  Musculoskeletal:        General: No deformity. Normal range of motion.     Cervical back: Normal range of motion and neck supple.  Skin:    General: Skin is warm and dry.      Findings: No erythema or rash.  Neurological:     Mental Status: She is alert and oriented to person, place, and time. Mental status is at baseline.     Cranial Nerves: No cranial nerve deficit.     Coordination: Coordination normal.  Psychiatric:        Mood and Affect: Mood normal.        Behavior: Behavior normal.        Thought Content: Thought content normal.   Breast exam is performed in seated and lying down position. Patient is status post left mastectomy  no evidence of any chest wall recurrence. No evidence of bilateral axillary adenopathy     LABORATORY DATA:  I have reviewed the data as listed Lab Results  Component Value Date   WBC 7.3 08/31/2020   HGB 13.4 08/31/2020   HCT 38.8 08/31/2020   MCV 86.6 08/31/2020   PLT 289 08/31/2020   Recent Labs    02/25/20 1341  NA 141  K 4.2  CL 107  CO2 26  GLUCOSE 136*  BUN 15  CREATININE 0.78  CALCIUM 8.8*  GFRNONAA >60  GFRAA >60  PROT 7.2  ALBUMIN 3.7  AST 25  ALT 33  ALKPHOS 69  BILITOT 0.3   Iron/TIBC/Ferritin/ %Sat No results found for: IRON, TIBC, FERRITIN, IRONPCTSAT    RADIOGRAPHIC STUDIES: I have personally reviewed the radiological images as listed and agreed with the findings in the report.  No results found.    ASSESSMENT & PLAN:  1. History of breast cancer   2. Long-term current use of tamoxifen   3. Hypocalcemia    History of pT2pN0 left breast IDC s/p left mastectomy and adjuvant radiation.  Reported to be ER PR positive HER-2 negative Labs are reviewed and discussed with patient. Continue adjuvant tamoxifen.  Plan 5 to 10 years if she tolerates treatments. She had a history of hysterectomy, last years hormone studies indicates she is still perimenopausal.  We will recheck in the future.  Recommend annual right unilateral mammogram.  Patient currently is uninsured and self-pay.  She does not have a primary care provider.  We will provide her information of community health  clinic.  I recommend patient to check cholesterol level she agrees.  Mild hypocalcemia, recommend calcium and vitamin D supplementation.  Calcium has normalized.    Orders Placed This Encounter  Procedures  . MM 3D SCREEN BREAST UNI RIGHT    Standing Status:  Future    Standing Expiration Date:   08/31/2021    Order Specific Question:   Reason for Exam (SYMPTOM  OR DIAGNOSIS REQUIRED)    Answer:   history of breast cancer    Order Specific Question:   Is the patient pregnant?    Answer:   No    Order Specific Question:   Preferred imaging location?    Answer:   [facility]  . Lipid panel    Standing Status:   Future    Standing Expiration Date:   08/31/2021    All questions were answered. The patient knows to call the clinic with any problems questions or concerns.   Return of visit: 6 months.  [name], MD, PhD Hematology Oncology [facility] at [facility] Pager- [phone] 08/31/2020

## 2020-08-31 NOTE — Progress Notes (Signed)
Pt here for follow up. Pt reports occasional pain/ spasms to to left breast, she is unsure if it is coming from implant. Pt complains of pain to legs at night.

## 2020-09-11 ENCOUNTER — Other Ambulatory Visit: Payer: Self-pay

## 2020-09-11 DIAGNOSIS — Z853 Personal history of malignant neoplasm of breast: Secondary | ICD-10-CM

## 2020-09-14 ENCOUNTER — Inpatient Hospital Stay: Payer: Medicaid - Out of State

## 2020-09-14 DIAGNOSIS — Z853 Personal history of malignant neoplasm of breast: Secondary | ICD-10-CM

## 2020-09-14 DIAGNOSIS — D0512 Intraductal carcinoma in situ of left breast: Secondary | ICD-10-CM | POA: Diagnosis not present

## 2020-09-14 DIAGNOSIS — Z79810 Long term (current) use of selective estrogen receptor modulators (SERMs): Secondary | ICD-10-CM

## 2020-09-14 LAB — LIPID PANEL
Cholesterol: 224 mg/dL — ABNORMAL HIGH (ref 0–200)
HDL: 45 mg/dL (ref >40)
LDL Cholesterol: 160 mg/dL — ABNORMAL HIGH (ref 0–99)
Total CHOL/HDL Ratio: 5 RATIO
Triglycerides: 95 mg/dL (ref <150)
VLDL: 19 mg/dL (ref 0–40)

## 2020-09-15 ENCOUNTER — Encounter: Payer: Self-pay | Admitting: Oncology

## 2020-09-15 ENCOUNTER — Telehealth: Payer: Self-pay

## 2020-09-15 NOTE — Telephone Encounter (Signed)
-----   Message from [name], MD sent at 09/14/2020  9:41 PM EST ----- Let her know that her LDL- aka the bad cholesterol is high, total cholesterol is high too. Recommend her to see pcp for discussion of management. Thanks.

## 2020-09-15 NOTE — Telephone Encounter (Signed)
She can start with low cholesterol diet and consider exercise as tolerated, to see if life style modification can help to lower her cholesterol. It is usually the first step to treatment high cholesterol anyway. Her PCP can further evaluate and follow up her levels and decide if she needs to take cholesterol meds or not.

## 2020-09-15 NOTE — Telephone Encounter (Signed)
Pt notified of MD response via Mychart.

## 2020-09-15 NOTE — Telephone Encounter (Signed)
Patient notified and states that she does not have a PCP currently due to insurance and it may be about a month before she establishes care. Pt states that Dr. Tasia Catchings discussed with her the possibility of starting her on something while she establishes care with PCP. Will call pt back with MD response. Please advise.

## 2021-02-12 ENCOUNTER — Ambulatory Visit
Admission: RE | Admit: 2021-02-12 | Discharge: 2021-02-12 | Disposition: A | Discharge status: Home / Self Care | Payer: Medicaid - Out of State | Source: Ambulatory Visit | Attending: Oncology | Admitting: Oncology

## 2021-02-12 ENCOUNTER — Other Ambulatory Visit: Payer: Self-pay

## 2021-02-12 DIAGNOSIS — Z1231 Encounter for screening mammogram for malignant neoplasm of breast: Secondary | ICD-10-CM | POA: Diagnosis not present

## 2021-02-12 DIAGNOSIS — Z853 Personal history of malignant neoplasm of breast: Secondary | ICD-10-CM | POA: Insufficient documentation

## 2021-02-19 ENCOUNTER — Other Ambulatory Visit: Payer: Medicaid - Out of State

## 2021-02-19 ENCOUNTER — Ambulatory Visit: Payer: Medicaid - Out of State | Admitting: Oncology

## 2021-03-08 ENCOUNTER — Encounter: Payer: Self-pay | Admitting: Oncology

## 2021-03-12 ENCOUNTER — Other Ambulatory Visit: Payer: Self-pay | Admitting: Oncology

## 2021-03-12 ENCOUNTER — Inpatient Hospital Stay: Payer: BLUE CROSS/BLUE SHIELD

## 2021-03-12 ENCOUNTER — Inpatient Hospital Stay: Payer: BLUE CROSS/BLUE SHIELD | Attending: Oncology | Admitting: Oncology

## 2021-03-12 ENCOUNTER — Encounter: Payer: Self-pay | Admitting: Oncology

## 2021-03-12 VITALS — BP 115/79 | HR 107 | Temp 99.1°F | Resp 18 | Wt 194.7 lb

## 2021-03-12 DIAGNOSIS — Z853 Personal history of malignant neoplasm of breast: Secondary | ICD-10-CM

## 2021-03-12 DIAGNOSIS — Z9012 Acquired absence of left breast and nipple: Secondary | ICD-10-CM | POA: Diagnosis not present

## 2021-03-12 DIAGNOSIS — E785 Hyperlipidemia, unspecified: Secondary | ICD-10-CM | POA: Diagnosis not present

## 2021-03-12 DIAGNOSIS — C50412 Malignant neoplasm of upper-outer quadrant of left female breast: Secondary | ICD-10-CM | POA: Diagnosis not present

## 2021-03-12 DIAGNOSIS — Z808 Family history of malignant neoplasm of other organs or systems: Secondary | ICD-10-CM | POA: Diagnosis not present

## 2021-03-12 DIAGNOSIS — Z170 Estrogen receptor positive status [ER+]: Secondary | ICD-10-CM | POA: Diagnosis not present

## 2021-03-12 DIAGNOSIS — Z90710 Acquired absence of both cervix and uterus: Secondary | ICD-10-CM | POA: Diagnosis not present

## 2021-03-12 DIAGNOSIS — Z79810 Long term (current) use of selective estrogen receptor modulators (SERMs): Secondary | ICD-10-CM | POA: Insufficient documentation

## 2021-03-12 NOTE — Progress Notes (Signed)
Pt here for follow up. Pt reports bruise under right big toe. 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 Pt reports no new breast problems.

## 2021-03-12 NOTE — Progress Notes (Signed)
Hematology/Oncology follow-up note [facility] Telephone:(336) [phone] Fax:(336) [phone]   Patient Care Team: [name], MD as PCP - General (Oncology) Arnetha Gula, MD (Family Medicine)  REFERRING PROVIDER: Arnetha Gula, MD  CHIEF COMPLAINTS/REASON FOR VISIT:  Follow-up for breast cancer.   HISTORY OF PRESENTING ILLNESS:  Alexandra Byrd is a  55 y.o.  female with PMH listed below was seen in consultation at the request of  Arnetha Gula, MD  for evaluation of history of breast cancer.  Patient reports that she had breast cancer diagnosed in 2017 and was treated at cancer treatments centers of [place].   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 There is an associated suspicious mass in the 2 o'clock position of the left breast worrisome for an invasive component of disease. In total, the suspicious microcalcifications span approximately 9.5 x 7.9 x 8.5 cm. No evidence of left axillary lymphadenopathy.No evidence of malignancy in the right breast.  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  Patient then went to outside facility for treatment.   I obtained patient's medical records from cancer treatment centers of [place]. Extensive medical records review was performed by me. 01/19/2016 left mastectomy pathology invasive mammary carcinoma, Nottingham grade 2 out of 3, 2.1 cm in greatest dimension, extensive DCIS, multiple foci.  Intermediate to high-grade with comedonecrosis and a micro-calcification.  Inked surgical resection margins negative for invasive carcinoma.  Closest surgical margin less than 0.1 cm posterior.  DCIS present at inked anterior surgical resection H and upper outer quadrant.  Skin/nipple with DCIS present in deep dermis.  2 left  sentinel lymph nodes negative for carcinoma.  pT2pN0  Per Dr.Pabbathi Haritha oncology note, MammaPrint low risk luminal a type,Per physician note, IDC ER PR positive HER-2 negative [path report stated hormone receptor status was sent out to [facility] laboratory.  I don't have the reports available at the time of dictation]  Patient underwent adjuvant radiation completed 04/11/2016. Started on adjuvant tamoxifen 20 mg daily since 03/2016.  #Uterine leiomyoma Jan 12, 2016 ultrasound pelvis transvaginal showed the uterus demonstrates suspected fibroid changes and is mildly enlarged and heterogeneous.  The endometrial stripe complex is not well visualized but measures less than a centimeter in thickness.  No dominant adnexal lesions. 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 She requests me to refill some of there medications.   Cataracts, possible due to tamoxifen use cataract extraction with intraocular lens placement can be considered  # 02/17/2020 unilateral right screening mammogram showed no mammographic evidence of malignancy.- INTERVAL HISTORY Alexandra Byrd is a 55 y.o. female who has above history reviewed by me today presents for follow up visit for Breast cancer  Problems and complaints are listed below:  Patient has been on tamoxifen Since 2017. She takes Effexor for vasomotor symptoms.  She has no new breast concerns and other new complaints.    Review of Systems  Constitutional:  Negative for appetite change, chills, fatigue and fever.  HENT:   Negative for hearing loss and voice change.   Eyes:  Negative for eye problems.  Respiratory:  Negative for chest tightness and cough.   Cardiovascular:  Negative for chest pain.  Gastrointestinal:  Negative for abdominal distention, abdominal pain and blood in stool.  Endocrine: Negative for hot flashes.   Genitourinary:  Negative for difficulty urinating and frequency.   Musculoskeletal:  Negative for arthralgias.  Skin:  Negative for itching and rash.  Neurological:  Negative for extremity weakness.  Hematological:  Negative for adenopathy.  Psychiatric/Behavioral:  Negative for confusion.    MEDICAL HISTORY:  Past Medical History:  Diagnosis Date   Anxiety    Breast cancer ([place])    Cancer ([place]) 11-06-15   left breast   Depression    EBV infection    GERD (gastroesophageal reflux disease)    Hashimoto's thyroiditis     SURGICAL HISTORY: Past Surgical History:  Procedure Laterality Date   BLADDER SURGERY  1974   BREAST BIOPSY Left 11-06-15   DUCTAL CARCINOMA IN SITU, NUCLEAR GRADE 2,   CESAREAN SECTION  1997, 2012   HYSTEROSCOPY WITH D & C N/A 11/20/2015   Procedure: DILATATION AND CURETTAGE /HYSTEROSCOPY;  Surgeon: Honor Loh Ward, MD;  Location: [facility] ORS;  Service: Gynecology;  Laterality: N/A;   MASTECTOMY Left 2017   UTERINE FIBROID SURGERY  2009    SOCIAL HISTORY: Social History   Socioeconomic History   Marital status: Married    Spouse name: Not on file   Number of children: Not on file   Years of education: Not on file   Highest education level: Not on file  Occupational History   Not on file  Tobacco Use   Smoking status: Former    Years: 5.00    Types: Cigarettes    Quit date: 2009    Years since quitting: 13.5   Smokeless tobacco: Never  Vaping Use   Vaping Use: Never used  Substance and Sexual Activity   Alcohol use: Never    Alcohol/week: 0.0 standard drinks   Drug use: Not Currently   Sexual activity: Not on file  Other Topics Concern   Not on file  Social History Narrative   Not on file   Social Determinants of Health   Financial Resource Strain: Not on file  Food Insecurity: Not on file  Transportation Needs: Not on file  Physical Activity: Not on file  Stress: Not on file  Social Connections: Not on file  Intimate Partner Violence:  Not on file    FAMILY HISTORY: Family History  Problem Relation Age of Onset   Cancer Mother        thyroid/throat   Prostate cancer Paternal Uncle    Leukemia Paternal Grandfather    Breast cancer Neg Hx     ALLERGIES:  is allergic to penicillins.  MEDICATIONS:  Current Outpatient Medications  Medication Sig Dispense Refill   ARMOUR THYROID 60 MG tablet Take 90 mg by mouth daily before breakfast.   2   atorvastatin (LIPITOR) 20 MG tablet Take 20 mg by mouth at bedtime.     Cholecalciferol (VITAMIN D3) 5000 units CAPS Take 50,000 Units by mouth.      SUMAtriptan (IMITREX) 100 MG tablet Take 1 tablet (100 mg total) by mouth as needed. 10 tablet 0   tamoxifen (NOLVADEX) 20 MG tablet Take 1 tablet (20 mg total) by mouth daily. 90 tablet 1   venlafaxine XR (EFFEXOR-XR) 75 MG 24 hr capsule Take 1 capsule (75 mg total) by mouth daily. 90 capsule 1   No current facility-administered medications for this visit.     PHYSICAL EXAMINATION: ECOG PERFORMANCE STATUS: 0 - Asymptomatic Vitals:   03/12/21 1215  BP:  115/79  Pulse: (!) 107  Resp: 18  Temp: 99.1 F (37.3 C)   Filed Weights   03/12/21 1215  Weight: 194 lb 11.2 oz (88.3 kg)    Physical Exam Constitutional:      General: She is not in acute distress. HENT:     Head: Normocephalic and atraumatic.  Eyes:     General: No scleral icterus.    Pupils: Pupils are equal, round, and reactive to light.  Cardiovascular:     Rate and Rhythm: Normal rate and regular rhythm.     Heart sounds: Normal heart sounds.  Pulmonary:     Effort: Pulmonary effort is normal. No respiratory distress.     Breath sounds: No wheezing.  Abdominal:     General: Bowel sounds are normal. There is no distension.     Palpations: Abdomen is soft. There is no mass.     Tenderness: There is no abdominal tenderness.  Musculoskeletal:        General: No deformity. Normal range of motion.     Cervical back: Normal range of motion and neck supple.   Skin:    General: Skin is warm and dry.     Findings: No erythema or rash.  Neurological:     Mental Status: She is alert and oriented to person, place, and time. Mental status is at baseline.     Cranial Nerves: No cranial nerve deficit.     Coordination: Coordination normal.  Psychiatric:        Mood and Affect: Mood normal.        Behavior: Behavior normal.        Thought Content: Thought content normal.      LABORATORY DATA:  I have reviewed the data as listed Lab Results  Component Value Date   WBC 7.3 08/31/2020   HGB 13.4 08/31/2020   HCT 38.8 08/31/2020   MCV 86.6 08/31/2020   PLT 289 08/31/2020   Recent Labs    08/31/20 1314  NA 139  K 4.2  CL 107  CO2 22  GLUCOSE 107*  BUN 19  CREATININE 0.96  CALCIUM 9.5  GFRNONAA >60  PROT 7.6  ALBUMIN 4.3  AST 37  ALT 41  ALKPHOS 56  BILITOT 0.4    Iron/TIBC/Ferritin/ %Sat No results found for: IRON, TIBC, FERRITIN, IRONPCTSAT    RADIOGRAPHIC STUDIES: I have personally reviewed the radiological images as listed and agreed with the findings in the report.  MM 3D SCREEN BREAST UNI RIGHT  Result Date: 02/15/2021 CLINICAL DATA:  Screening. EXAM: DIGITAL SCREENING UNILATERAL RIGHT MAMMOGRAM WITH CAD AND TOMOSYNTHESIS TECHNIQUE: Right screening digital craniocaudal and mediolateral oblique mammograms were obtained. Right screening digital breast tomosynthesis was performed. The images were evaluated with computer-aided detection. COMPARISON:  Previous exam(s). ACR Breast Density Category b: There are scattered areas of fibroglandular density. FINDINGS: The patient has had a left mastectomy. There are no findings suspicious for malignancy. IMPRESSION: No mammographic evidence of malignancy. A result letter of this screening mammogram will be mailed directly to the patient. RECOMMENDATION: Screening mammogram in one year.  (Code:SM-R-41M) BI-RADS CATEGORY  1: Negative. Electronically Signed   By: Kristopher Oppenheim M.D.   On:  02/15/2021 14:38     ASSESSMENT & PLAN:  1. History of breast cancer   2. Long-term current use of tamoxifen   3. Hyperlipidemia, unspecified hyperlipidemia type    History of pT2pN0 left breast IDC s/p left mastectomy and adjuvant radiation.  Reported to be ER PR  positive HER-2 negative Labs are reviewed and discussed with patient. She has been on Tamoxifen for about 5 years now. She is interested in discontinuing Tamoxifen.  I will check BCI and we will contact her for results.  02/12/2021 annual screening unilateral mammogram reviewed and discussed with patient.   She had a history of hysterectomy, last years hormone studies indicates she is still perimenopausal. I will check [place] and estradiol level at next visit.   Hyperlipidemia, she has been started on Atorvastatin. Continue follow up with PCP.    Orders Placed This Encounter  Procedures   CBC with Differential/Platelet    Standing Status:   Future    Standing Expiration Date:   03/12/2022   Comprehensive metabolic panel    Standing Status:   Future    Standing Expiration Date:   03/12/2022    All questions were answered. The patient knows to call the clinic with any problems questions or concerns.   Return of visit:  12 months.   [name], MD, PhD Hematology Oncology [facility] at [facility] Pager- [phone] 03/12/2021

## 2021-03-15 ENCOUNTER — Telehealth: Payer: Self-pay

## 2021-03-15 NOTE — Telephone Encounter (Signed)
Request for BCI testing faxed to [facility] on breast tissue specimen [id number] collected on 01/05/16 at [facility] med center in [place].

## 2021-03-23 NOTE — Telephone Encounter (Signed)
Contacted [facility] to follow up on status of BCI testing. They said that they are waiting on call back from pt and then they will proceed with testing. Mychart message sent to pt to notify her of this.

## 2021-04-05 ENCOUNTER — Encounter: Payer: Self-pay | Admitting: Oncology

## 2021-04-05 NOTE — Telephone Encounter (Signed)
BCI results scanned in media .

## 2021-04-06 NOTE — Telephone Encounter (Signed)
Pt informed and verbalized understanding

## 2021-09-25 IMAGING — MG MM DIGITAL SCREENING UNILAT*R* W/ TOMO W/ CAD
4 series · 4 of 12 positions shown · non-contrast
Comparison: Previous exam(s).

CLINICAL DATA: Screening.

EXAM:
DIGITAL SCREENING UNILATERAL RIGHT MAMMOGRAM WITH CAD AND
TOMOSYNTHESIS
TECHNIQUE: Right screening digital craniocaudal and mediolateral oblique
mammograms were obtained. Right screening digital breast
tomosynthesis was performed. The images were evaluated with
computer-aided detection.

[R CC synth-2D]
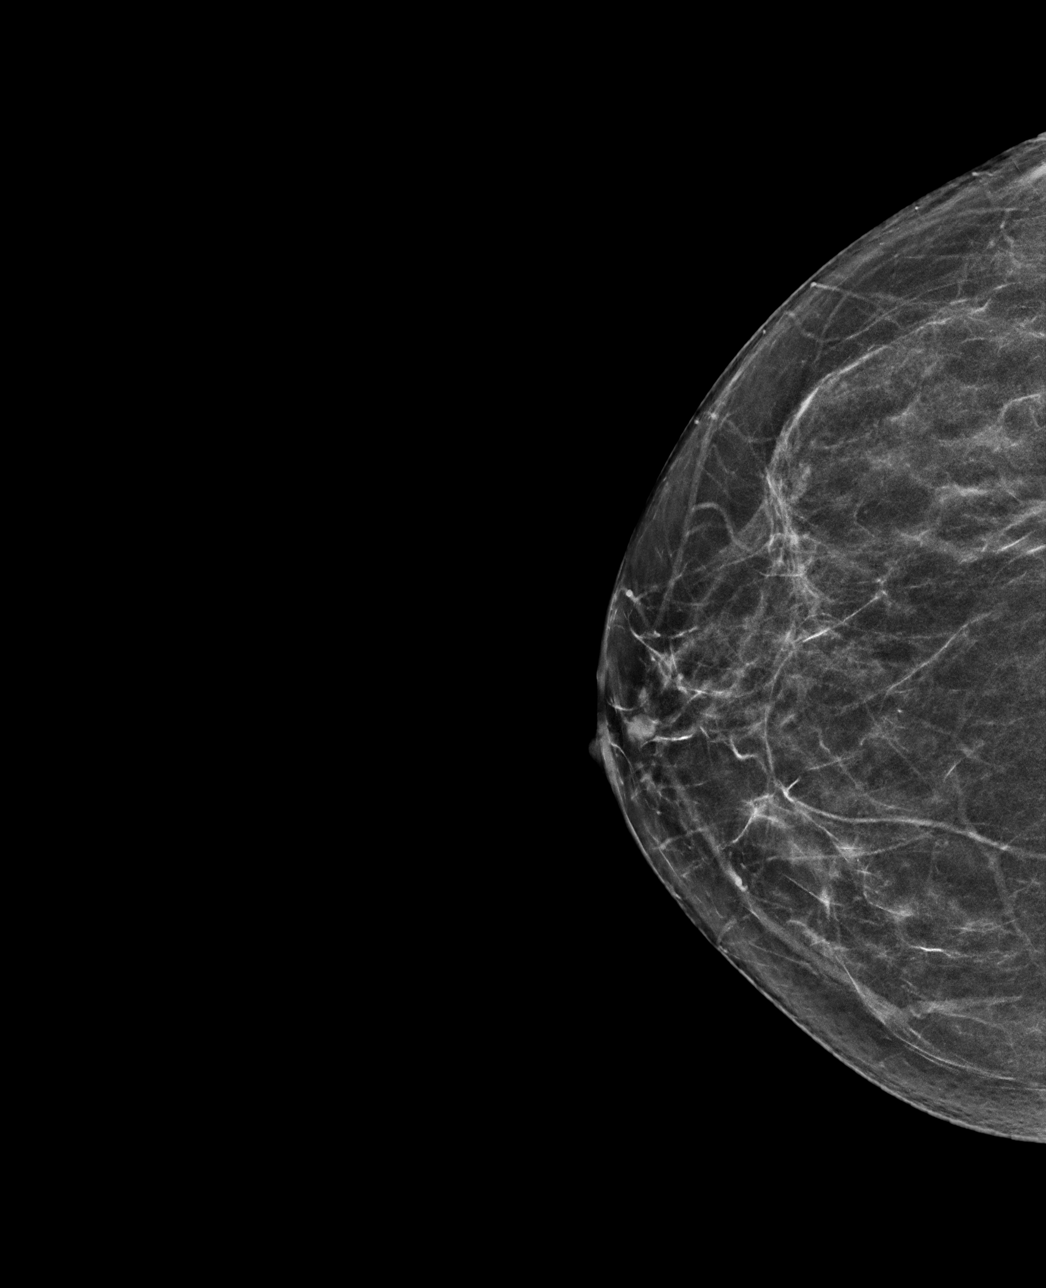

[R MLO synth-2D]
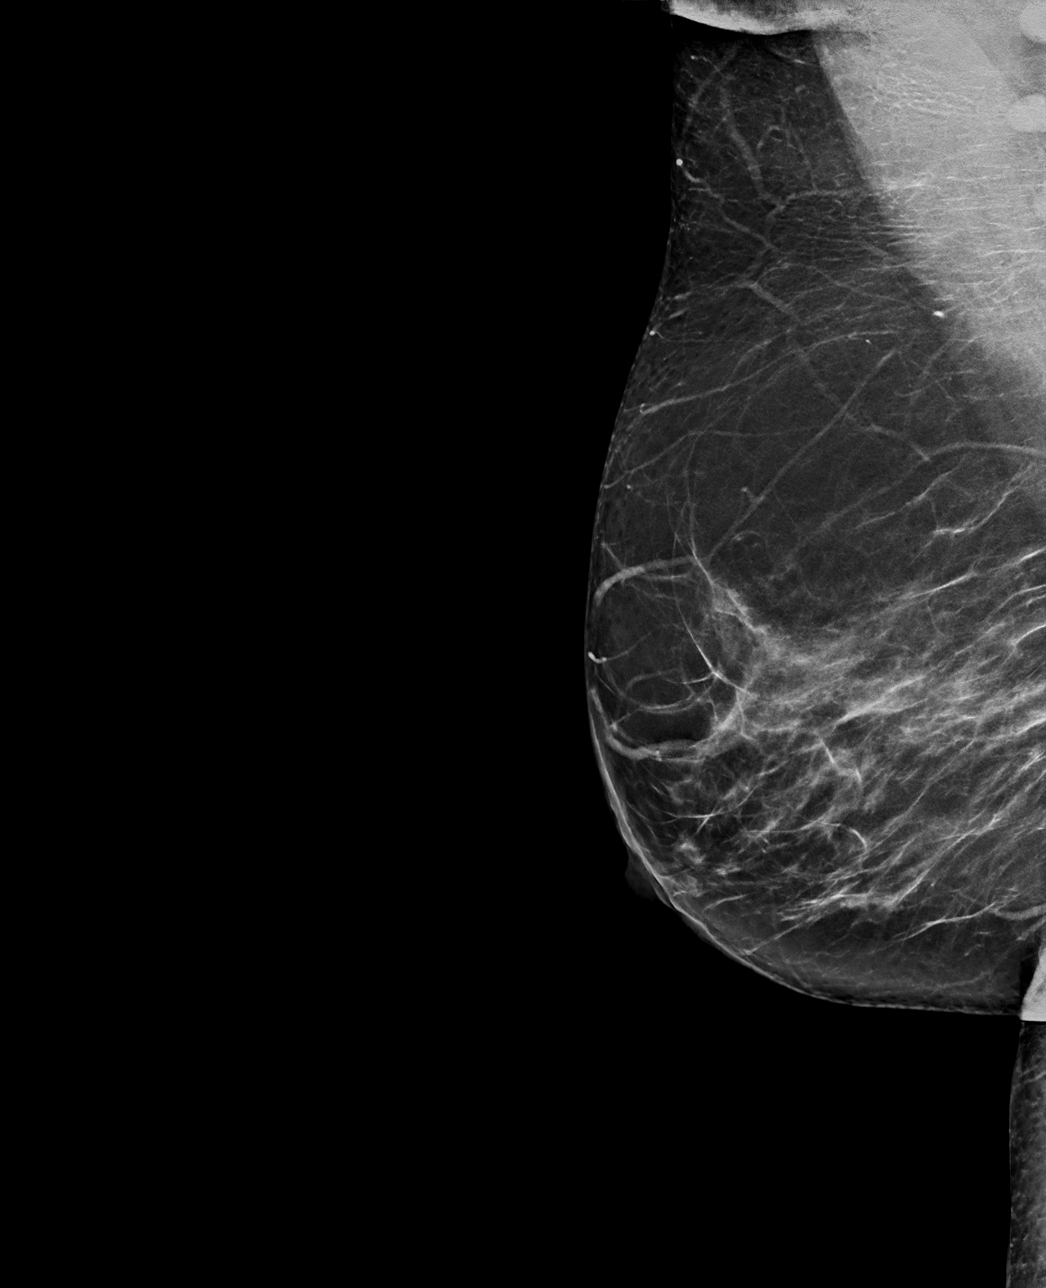

[R MLO tomo · tomo slice 37/73.0]
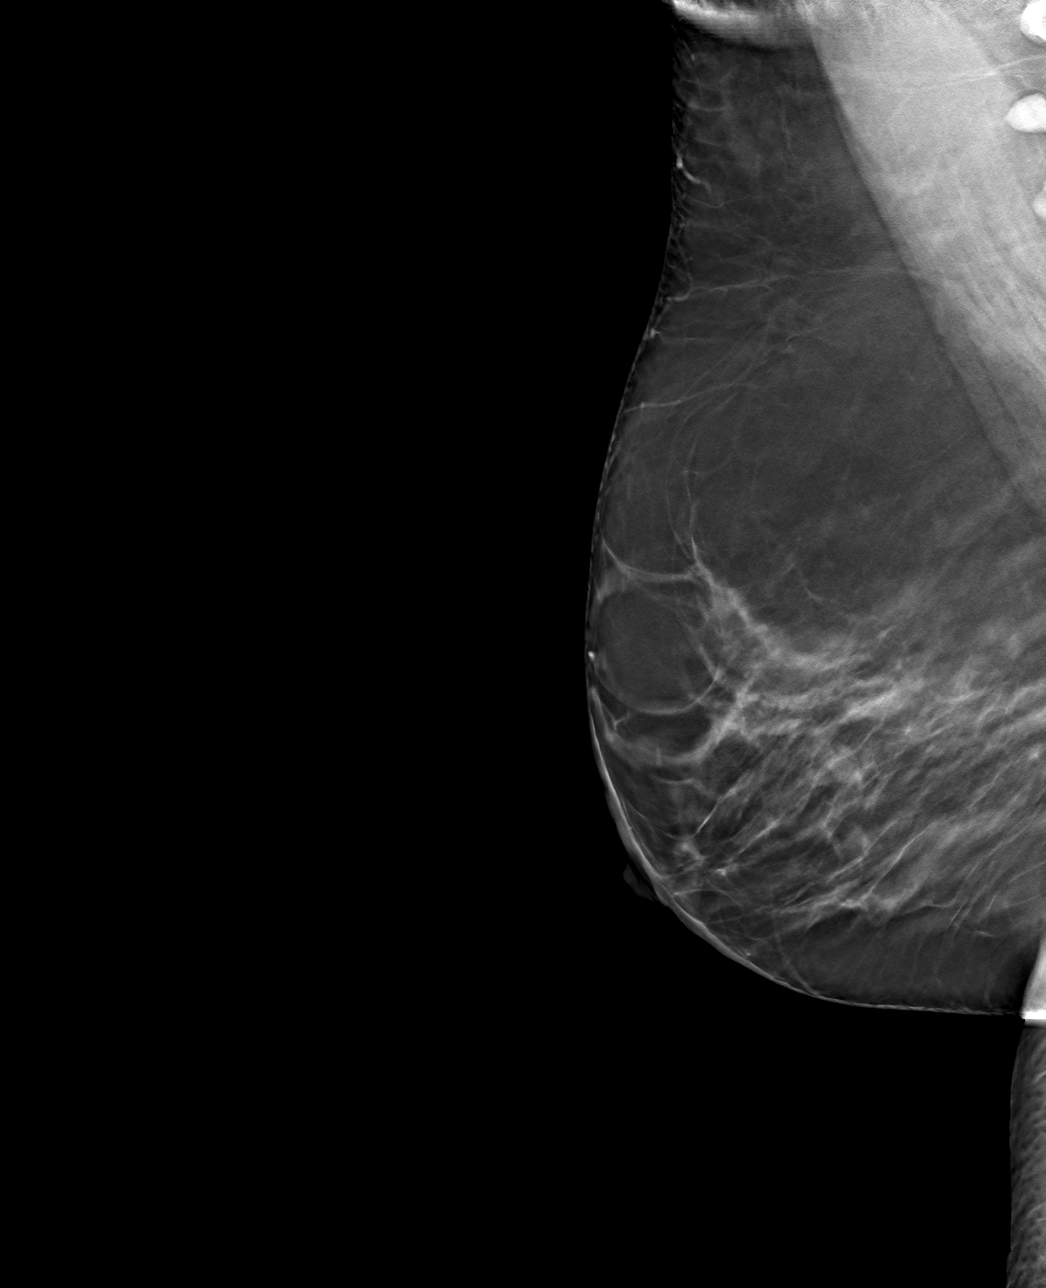

[R CC tomo · tomo slice 31/61.0]
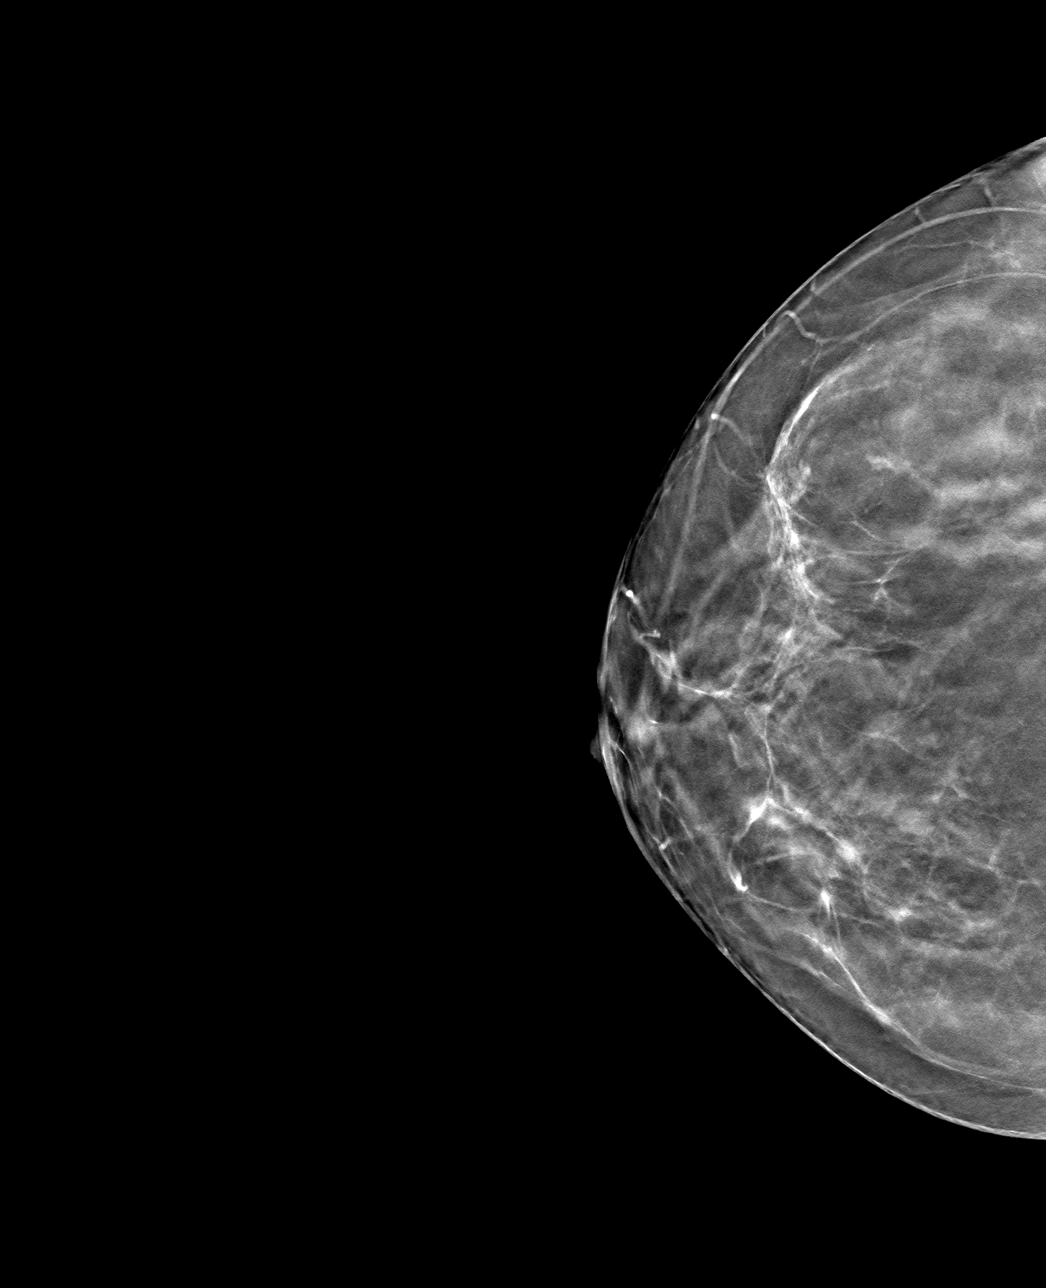

[4 of 12 positions shown; findings below may reference images not displayed]

ACR Breast Density Category b: There are scattered areas of
fibroglandular density.
FINDINGS: The patient has had a left mastectomy. There are no findings
suspicious for malignancy.
IMPRESSION: No mammographic evidence of malignancy. A result letter of this
screening mammogram will be mailed directly to the patient.

RECOMMENDATION:
Screening mammogram in one year.  (Code:NT-E-EGT)

BI-RADS CATEGORY  1: Negative.

## 2021-12-24 ENCOUNTER — Telehealth: Payer: Self-pay | Admitting: Oncology

## 2021-12-24 ENCOUNTER — Other Ambulatory Visit: Payer: Self-pay | Admitting: Oncology

## 2021-12-24 DIAGNOSIS — Z1231 Encounter for screening mammogram for malignant neoplasm of breast: Secondary | ICD-10-CM

## 2021-12-24 NOTE — Telephone Encounter (Signed)
pt called in to r/s appt .Cherylann Banas  ?

## 2022-03-09 ENCOUNTER — Other Ambulatory Visit: Payer: Self-pay

## 2022-03-09 DIAGNOSIS — Z853 Personal history of malignant neoplasm of breast: Secondary | ICD-10-CM

## 2022-03-10 ENCOUNTER — Ambulatory Visit
Admission: RE | Admit: 2022-03-10 | Discharge: 2022-03-10 | Disposition: A | Discharge status: Home / Self Care | Payer: BLUE CROSS/BLUE SHIELD | Source: Ambulatory Visit | Attending: Oncology | Admitting: Oncology

## 2022-03-10 ENCOUNTER — Inpatient Hospital Stay (HOSPITAL_BASED_OUTPATIENT_CLINIC_OR_DEPARTMENT_OTHER): Payer: BLUE CROSS/BLUE SHIELD | Admitting: Oncology

## 2022-03-10 ENCOUNTER — Encounter: Payer: Self-pay | Admitting: Oncology

## 2022-03-10 ENCOUNTER — Other Ambulatory Visit: Payer: Self-pay | Admitting: Oncology

## 2022-03-10 ENCOUNTER — Other Ambulatory Visit: Payer: Self-pay

## 2022-03-10 ENCOUNTER — Inpatient Hospital Stay: Payer: BLUE CROSS/BLUE SHIELD | Attending: Oncology

## 2022-03-10 VITALS — BP 104/68 | HR 115 | Temp 98.5°F | Resp 16 | Wt 194.0 lb

## 2022-03-10 DIAGNOSIS — Z853 Personal history of malignant neoplasm of breast: Secondary | ICD-10-CM

## 2022-03-10 DIAGNOSIS — Z808 Family history of malignant neoplasm of other organs or systems: Secondary | ICD-10-CM | POA: Diagnosis not present

## 2022-03-10 DIAGNOSIS — D72820 Lymphocytosis (symptomatic): Secondary | ICD-10-CM | POA: Diagnosis not present

## 2022-03-10 DIAGNOSIS — Z1231 Encounter for screening mammogram for malignant neoplasm of breast: Secondary | ICD-10-CM | POA: Insufficient documentation

## 2022-03-10 DIAGNOSIS — F39 Unspecified mood [affective] disorder: Secondary | ICD-10-CM | POA: Insufficient documentation

## 2022-03-10 DIAGNOSIS — Z923 Personal history of irradiation: Secondary | ICD-10-CM | POA: Diagnosis not present

## 2022-03-10 DIAGNOSIS — Z806 Family history of leukemia: Secondary | ICD-10-CM | POA: Insufficient documentation

## 2022-03-10 DIAGNOSIS — H269 Unspecified cataract: Secondary | ICD-10-CM | POA: Insufficient documentation

## 2022-03-10 DIAGNOSIS — Z90710 Acquired absence of both cervix and uterus: Secondary | ICD-10-CM | POA: Diagnosis not present

## 2022-03-10 DIAGNOSIS — Z9012 Acquired absence of left breast and nipple: Secondary | ICD-10-CM | POA: Insufficient documentation

## 2022-03-10 DIAGNOSIS — Z87891 Personal history of nicotine dependence: Secondary | ICD-10-CM | POA: Diagnosis not present

## 2022-03-10 LAB — COMPREHENSIVE METABOLIC PANEL
ALT: 42 U/L (ref 0–44)
AST: 34 U/L (ref 15–41)
Albumin: 4.2 g/dL (ref 3.5–5.0)
Alkaline Phosphatase: 113 U/L (ref 38–126)
Anion gap: 5 (ref 5–15)
BUN: 14 mg/dL (ref 6–20)
CO2: 25 mmol/L (ref 22–32)
Calcium: 9.1 mg/dL (ref 8.9–10.3)
Chloride: 107 mmol/L (ref 98–111)
Creatinine, Ser: 0.96 mg/dL (ref 0.44–1.00)
GFR, Estimated: >60 mL/min (ref >60)
Glucose, Bld: 130 mg/dL — ABNORMAL HIGH (ref 70–99)
Potassium: 3.8 mmol/L (ref 3.5–5.1)
Sodium: 137 mmol/L (ref 135–145)
Total Bilirubin: 0.5 mg/dL (ref 0.3–1.2)
Total Protein: 7.6 g/dL (ref 6.5–8.1)

## 2022-03-10 LAB — CBC WITH DIFFERENTIAL/PLATELET
Abs Immature Granulocytes: 0.02 10*3/uL (ref 0.00–0.07)
Basophils Absolute: 0.1 10*3/uL (ref 0.0–0.1)
Basophils Relative: 1 %
Eosinophils Absolute: 0.2 10*3/uL (ref 0.0–0.5)
Eosinophils Relative: 3 %
HCT: 44.1 % (ref 36.0–46.0)
Hemoglobin: 14.8 g/dL (ref 12.0–15.0)
Immature Granulocytes: 0 %
Lymphocytes Relative: 30 %
Lymphs Abs: 2.3 10*3/uL (ref 0.7–4.0)
MCH: 28.9 pg (ref 26.0–34.0)
MCHC: 33.6 g/dL (ref 30.0–36.0)
MCV: 86.1 fL (ref 80.0–100.0)
Monocytes Absolute: 0.6 10*3/uL (ref 0.1–1.0)
Monocytes Relative: 8 %
Neutro Abs: 4.6 10*3/uL (ref 1.7–7.7)
Neutrophils Relative %: 58 %
Platelets: 272 10*3/uL (ref 150–400)
RBC: 5.12 MIL/uL — ABNORMAL HIGH (ref 3.87–5.11)
RDW: 12.7 % (ref 11.5–15.5)
WBC: 7.9 10*3/uL (ref 4.0–10.5)
nRBC: 0 % (ref 0.0–0.2)

## 2022-03-10 NOTE — Progress Notes (Signed)
Hematology/Oncology follow-up note [facility] [phone] [phone]   Patient Care Team: [name], MD as PCP - General (Oncology) Arnetha Gula, MD (Family Medicine)  REFERRING PROVIDER: [name], MD  CHIEF COMPLAINTS/REASON FOR VISIT:  Follow-up for breast cancer.   HISTORY OF PRESENTING ILLNESS:  Alexandra Byrd is a  56 y.o.  female with PMH listed below was seen in consultation at the request of  [name], MD  for evaluation of history of breast cancer.  Patient reports that she had breast cancer diagnosed in 2017 and was treated at cancer treatments centers of [place].   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 There is an associated suspicious mass in the 2 o'clock position of the left breast worrisome for an invasive component of disease. In total, the suspicious microcalcifications span approximately 9.5 x 7.9 x 8.5 cm. No evidence of left axillary lymphadenopathy.No evidence of malignancy in the right breast.  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  Patient then went to outside facility for treatment.   I obtained patient's medical records from cancer treatment centers of [place]. Extensive medical records review was performed by me. 01/19/2016 left mastectomy pathology invasive mammary carcinoma, Nottingham grade 2 out of 3, 2.1 cm in greatest dimension, extensive DCIS, multiple foci.  Intermediate to high-grade with comedonecrosis and a micro-calcification.  Inked surgical resection margins negative for invasive carcinoma.  Closest surgical margin less than 0.1 cm posterior.  DCIS present at inked anterior surgical resection H and upper outer quadrant.  Skin/nipple with DCIS present in deep dermis.  2 left sentinel lymph nodes  negative for carcinoma.  pT2pN0  Per Dr.Pabbathi Haritha oncology note, MammaPrint low risk luminal a type,Per physician note, IDC ER PR positive HER-2 negative [path report stated hormone receptor status was sent out to [facility] laboratory.  I don't have the reports available at the time of dictation]  Patient underwent adjuvant radiation completed 04/11/2016. Started on adjuvant tamoxifen 20 mg daily since 03/2016.  #Uterine leiomyoma Jan 12, 2016 ultrasound pelvis transvaginal showed the uterus demonstrates suspected fibroid changes and is mildly enlarged and heterogeneous.  The endometrial stripe complex is not well visualized but measures less than a centimeter in thickness.  No dominant adnexal lesions. 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 She requests me to refill some of there medications.   Cataracts, possible due to tamoxifen use cataract extraction with intraocular lens placement can be considered  # 02/17/2020 unilateral right screening mammogram showed no mammographic evidence of malignancy.  # BCI showed no benefit of extended endocrine therapy.  # Tamoxifen 03/2016- 03/2021  INTERVAL HISTORY Alexandra Byrd is a 56 y.o. female who has above history reviewed by me today presents for follow up visit for breast cancer  Since being off Tamoxifen, she has stopped Effexor, and resumed on Lexapro which controls her mood disorder better.  She has no new breast concerns and other new complaints.    Review of Systems  Constitutional:  Negative for appetite change, chills, fatigue and fever.  HENT:   Negative for hearing loss and voice change.   Eyes:  Negative for eye problems.  Respiratory:  Negative for chest tightness  and cough.   Cardiovascular:  Negative for chest pain.  Gastrointestinal:  Negative for abdominal distention, abdominal pain and  blood in stool.  Endocrine: Negative for hot flashes.  Genitourinary:  Negative for difficulty urinating and frequency.   Musculoskeletal:  Negative for arthralgias.  Skin:  Negative for itching and rash.  Neurological:  Negative for extremity weakness.  Hematological:  Negative for adenopathy.  Psychiatric/Behavioral:  Negative for confusion.     MEDICAL HISTORY:  Past Medical History:  Diagnosis Date   Anxiety    Breast cancer ([place])    Cancer ([place]) 11-06-15   left breast   Depression    EBV infection    GERD (gastroesophageal reflux disease)    Hashimoto's thyroiditis     SURGICAL HISTORY: Past Surgical History:  Procedure Laterality Date   BLADDER SURGERY  1974   BREAST BIOPSY Left 11-06-15   DUCTAL CARCINOMA IN SITU, NUCLEAR GRADE 2,   CESAREAN SECTION  1997, 2012   HYSTEROSCOPY WITH D & C N/A 11/20/2015   Procedure: DILATATION AND CURETTAGE /HYSTEROSCOPY;  Surgeon: Honor Loh Ward, MD;  Location: [facility] ORS;  Service: Gynecology;  Laterality: N/A;   MASTECTOMY Left 2017   UTERINE FIBROID SURGERY  2009    SOCIAL HISTORY: Social History   Socioeconomic History   Marital status: Married    Spouse name: Not on file   Number of children: Not on file   Years of education: Not on file   Highest education level: Not on file  Occupational History   Not on file  Tobacco Use   Smoking status: Former    Years: 5.00    Types: Cigarettes    Quit date: 2009    Years since quitting: 14.5   Smokeless tobacco: Never  Vaping Use   Vaping Use: Never used  Substance and Sexual Activity   Alcohol use: Never    Alcohol/week: 0.0 standard drinks of alcohol   Drug use: Not Currently   Sexual activity: Not on file  Other Topics Concern   Not on file  Social History Narrative   Not on file   Social Determinants of Health   Financial Resource Strain: Not on file  Food Insecurity: Not on file  Transportation Needs: Not on file  Physical Activity: Not on file  Stress: Not on  file  Social Connections: Not on file  Intimate Partner Violence: Not on file    FAMILY HISTORY: Family History  Problem Relation Age of Onset   Cancer Mother        thyroid/throat   Prostate cancer Paternal Uncle    Leukemia Paternal Grandfather    Breast cancer Neg Hx     ALLERGIES:  is allergic to penicillins.  MEDICATIONS:  Current Outpatient Medications  Medication Sig Dispense Refill   ARMOUR THYROID 60 MG tablet Take 90 mg by mouth daily before breakfast.   2   atorvastatin (LIPITOR) 20 MG tablet Take 20 mg by mouth at bedtime.     cariprazine (VRAYLAR) 1.5 MG capsule Take by mouth.     Cholecalciferol (VITAMIN D3) 5000 units CAPS Take 50,000 Units by mouth.      Escitalopram Oxalate (LEXAPRO PO) Take 30 mg by mouth daily.     levothyroxine (SYNTHROID) 112 MCG tablet Take 112 mcg by mouth daily before breakfast.     modafinil (PROVIGIL) 100 MG tablet Take 100 mg by mouth daily.     SUMAtriptan (IMITREX) 100 MG tablet Take 1 tablet (100 mg total) by mouth  as needed. 10 tablet 0   terbinafine (LAMISIL) 250 MG tablet  (Patient not taking: Reported on 03/10/2022)     tiZANidine (ZANAFLEX) 4 MG tablet Take 1 tablet every 6 hours by oral route as needed. (Patient not taking: Reported on 03/10/2022)     venlafaxine XR (EFFEXOR-XR) 75 MG 24 hr capsule Take 1 capsule (75 mg total) by mouth daily. (Patient not taking: Reported on 03/10/2022) 90 capsule 1   No current facility-administered medications for this visit.     PHYSICAL EXAMINATION: ECOG PERFORMANCE STATUS: 0 - Asymptomatic Vitals:   03/10/22 1439  BP: 104/68  Pulse: (!) 115  Resp: 16  Temp: 98.5 F (36.9 C)  SpO2: 96%   Filed Weights   03/10/22 1439  Weight: 194 lb (88 kg)    Physical Exam Constitutional:      General: She is not in acute distress. HENT:     Head: Normocephalic and atraumatic.  Eyes:     General: No scleral icterus.    Pupils: Pupils are equal, round, and reactive to light.   Cardiovascular:     Rate and Rhythm: Normal rate and regular rhythm.     Heart sounds: Normal heart sounds.  Pulmonary:     Effort: Pulmonary effort is normal. No respiratory distress.     Breath sounds: No wheezing.  Abdominal:     General: Bowel sounds are normal. There is no distension.     Palpations: Abdomen is soft. There is no mass.     Tenderness: There is no abdominal tenderness.  Musculoskeletal:        General: No deformity. Normal range of motion.     Cervical back: Normal range of motion and neck supple.  Skin:    General: Skin is warm and dry.     Findings: No erythema or rash.  Neurological:     Mental Status: She is alert and oriented to person, place, and time. Mental status is at baseline.     Cranial Nerves: No cranial nerve deficit.     Coordination: Coordination normal.  Psychiatric:        Mood and Affect: Mood normal.        Behavior: Behavior normal.        Thought Content: Thought content normal.       LABORATORY DATA:  I have reviewed the data as listed Lab Results  Component Value Date   WBC 7.9 03/10/2022   HGB 14.8 03/10/2022   HCT 44.1 03/10/2022   MCV 86.1 03/10/2022   PLT 272 03/10/2022   Recent Labs    03/10/22 1418  NA 137  K 3.8  CL 107  CO2 25  GLUCOSE 130*  BUN 14  CREATININE 0.96  CALCIUM 9.1  GFRNONAA >60  PROT 7.6  ALBUMIN 4.2  AST 34  ALT 42  ALKPHOS 113  BILITOT 0.5       RADIOGRAPHIC STUDIES: I have personally reviewed the radiological images as listed and agreed with the findings in the report.  No results found.    ASSESSMENT & PLAN:  1. History of breast cancer   2. 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  Reported to be ER PR positive HER-2 negative Labs are reviewed and discussed with patient. Finished 5 years of Tamoxifen.  03/10/2022 annual screening unilateral right mammogram is done and result is pending.    She had  a history of hysterectomy  #  history of monoclonal lymphocytosis. No leukocytosis. Check flowcytometry at next visit.   Orders Placed This Encounter  Procedures   MM 3D SCREEN BREAST UNI RIGHT    Standing Status:   Future    Standing Expiration Date:   03/11/2023    Order Specific Question:   Reason for Exam (SYMPTOM  OR DIAGNOSIS REQUIRED)    Answer:   breast cancer surveillance    Order Specific Question:   Is the patient pregnant?    Answer:   No    Order Specific Question:   Preferred imaging location?    Answer:   [facility]   CBC with Differential/Platelet    Standing Status:   Future    Standing Expiration Date:   03/11/2023   Comprehensive metabolic panel    Standing Status:   Future    Standing Expiration Date:   03/11/2023   Flow Cytometry, Peripheral Blood (Oncology)    Standing Status:   Future    Standing Expiration Date:   03/11/2023    All questions were answered. The patient knows to call the clinic with any problems questions or concerns.   Return of visit:  12 months.   [name], MD, PhD Hematology Oncology  03/10/2022

## 2022-03-15 ENCOUNTER — Ambulatory Visit: Payer: BLUE CROSS/BLUE SHIELD | Admitting: Oncology

## 2022-03-15 ENCOUNTER — Other Ambulatory Visit: Payer: BLUE CROSS/BLUE SHIELD

## 2022-11-09 LAB — COLOGUARD: COLOGUARD: NEGATIVE

## 2023-02-13 ENCOUNTER — Encounter: Payer: Self-pay | Admitting: Oncology

## 2023-02-14 ENCOUNTER — Other Ambulatory Visit: Payer: Self-pay

## 2023-02-14 DIAGNOSIS — Z853 Personal history of malignant neoplasm of breast: Secondary | ICD-10-CM

## 2023-02-15 NOTE — Telephone Encounter (Signed)
Mammogram order faxed to [facility]

## 2023-02-16 ENCOUNTER — Telehealth: Payer: Self-pay | Admitting: *Deleted

## 2023-02-16 NOTE — Telephone Encounter (Signed)
The office calling to get NPI for  Dr. Cathie Hoops in order to use the order for the mammogram . I gave the NPI number to them

## 2023-03-13 ENCOUNTER — Other Ambulatory Visit: Payer: BLUE CROSS/BLUE SHIELD

## 2023-03-13 ENCOUNTER — Ambulatory Visit: Payer: BLUE CROSS/BLUE SHIELD | Admitting: Oncology

## 2023-04-07 ENCOUNTER — Encounter: Payer: Self-pay | Admitting: Oncology

## 2023-04-11 ENCOUNTER — Other Ambulatory Visit: Payer: Self-pay

## 2023-04-11 DIAGNOSIS — D72820 Lymphocytosis (symptomatic): Secondary | ICD-10-CM

## 2023-04-12 ENCOUNTER — Inpatient Hospital Stay: Payer: BLUE CROSS/BLUE SHIELD

## 2023-04-12 ENCOUNTER — Inpatient Hospital Stay: Payer: BLUE CROSS/BLUE SHIELD | Attending: Oncology | Admitting: Oncology

## 2023-04-12 ENCOUNTER — Telehealth: Payer: Self-pay

## 2023-04-12 ENCOUNTER — Encounter: Payer: Self-pay | Admitting: Oncology

## 2023-04-12 VITALS — BP 108/77 | HR 80 | Temp 97.6°F | Resp 18 | Wt 206.9 lb

## 2023-04-12 DIAGNOSIS — Z853 Personal history of malignant neoplasm of breast: Secondary | ICD-10-CM | POA: Insufficient documentation

## 2023-04-12 DIAGNOSIS — D259 Leiomyoma of uterus, unspecified: Secondary | ICD-10-CM | POA: Diagnosis not present

## 2023-04-12 DIAGNOSIS — Z87891 Personal history of nicotine dependence: Secondary | ICD-10-CM | POA: Diagnosis not present

## 2023-04-12 DIAGNOSIS — D72820 Lymphocytosis (symptomatic): Secondary | ICD-10-CM | POA: Insufficient documentation

## 2023-04-12 DIAGNOSIS — Z806 Family history of leukemia: Secondary | ICD-10-CM | POA: Diagnosis not present

## 2023-04-12 LAB — CBC WITH DIFFERENTIAL (CANCER CENTER ONLY)
Abs Immature Granulocytes: 0.01 10*3/uL (ref 0.00–0.07)
Basophils Absolute: 0.1 10*3/uL (ref 0.0–0.1)
Basophils Relative: 1 %
Eosinophils Absolute: 0.2 10*3/uL (ref 0.0–0.5)
Eosinophils Relative: 4 %
HCT: 41.7 % (ref 36.0–46.0)
Hemoglobin: 13.6 g/dL (ref 12.0–15.0)
Immature Granulocytes: 0 %
Lymphocytes Relative: 39 %
Lymphs Abs: 2.5 10*3/uL (ref 0.7–4.0)
MCH: 29.6 pg (ref 26.0–34.0)
MCHC: 32.6 g/dL (ref 30.0–36.0)
MCV: 90.7 fL (ref 80.0–100.0)
Monocytes Absolute: 0.4 10*3/uL (ref 0.1–1.0)
Monocytes Relative: 6 %
Neutro Abs: 3.2 10*3/uL (ref 1.7–7.7)
Neutrophils Relative %: 50 %
Platelet Count: 307 10*3/uL (ref 150–400)
RBC: 4.6 MIL/uL (ref 3.87–5.11)
RDW: 13 % (ref 11.5–15.5)
WBC Count: 6.3 10*3/uL (ref 4.0–10.5)
nRBC: 0 % (ref 0.0–0.2)

## 2023-04-12 LAB — CMP (CANCER CENTER ONLY)
ALT: 39 U/L (ref 0–44)
AST: 31 U/L (ref 15–41)
Albumin: 4.2 g/dL (ref 3.5–5.0)
Alkaline Phosphatase: 101 U/L (ref 38–126)
Anion gap: 7 (ref 5–15)
BUN: 12 mg/dL (ref 6–20)
CO2: 26 mmol/L (ref 22–32)
Calcium: 9.4 mg/dL (ref 8.9–10.3)
Chloride: 106 mmol/L (ref 98–111)
Creatinine: 0.79 mg/dL (ref 0.44–1.00)
GFR, Estimated: >60 mL/min (ref >60)
Glucose, Bld: 112 mg/dL — ABNORMAL HIGH (ref 70–99)
Potassium: 4.1 mmol/L (ref 3.5–5.1)
Sodium: 139 mmol/L (ref 135–145)
Total Bilirubin: 0.3 mg/dL (ref 0.3–1.2)
Total Protein: 7.6 g/dL (ref 6.5–8.1)

## 2023-04-12 NOTE — Progress Notes (Signed)
Hematology/Oncology Progress note [phone] [phone]      Patient Care Team: Rickard Patience, MD as PCP - General (Oncology) Jonnie Kind, MD (Family Medicine)   CHIEF COMPLAINTS/REASON FOR VISIT:  Follow-up for breast cancer.   ASSESSMENT & PLAN:   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 Check flowcytometry, result is pending  History of breast cancer History of pT2pN0 left breast IDC s/p left mastectomy and adjuvant radiation.  Reported to be ER PR positive HER-2 negative Labs are reviewed and discussed with patient. Finished 5 years of Tamoxifen.  Recent mammogram results were reviewed.  Continue annual mammogram  Orders Placed This Encounter  Procedures   MM 3D SCREENING MAMMOGRAM UNILATERAL RIGHT BREAST    Standing Status:   Future    Standing Expiration Date:   04/11/2024    Order Specific Question:   Reason for Exam (SYMPTOM  OR DIAGNOSIS REQUIRED)    Answer:   screnning mammogram ,left breast mastectomy    Order Specific Question:   Preferred imaging location?    Answer:   [facility]    Order Specific Question:   Is the patient pregnant?    Answer:   No   Follow up in 1 year All questions were answered. The patient knows to call the clinic with any problems, questions or concerns.  Rickard Patience, MD, PhD [facility] Hematology Oncology 04/12/2023    HISTORY OF PRESENTING ILLNESS:  Patient reports that she had breast cancer diagnosed in 2017 and was treated at cancer treatments centers of [place].   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 There is an associated suspicious mass in the 2 o'clock position of the left breast worrisome for an invasive component of disease. In total, the suspicious microcalcifications span approximately  9.5 x 7.9 x 8.5 cm. No evidence of left axillary lymphadenopathy.No evidence of malignancy in the right breast.  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  Patient then went to outside facility for treatment.   I obtained patient's medical records from cancer treatment centers of [place]. Extensive medical records review was performed by me. 01/19/2016 left mastectomy pathology invasive mammary carcinoma, Nottingham grade 2 out of 3, 2.1 cm in greatest dimension, extensive DCIS, multiple foci.  Intermediate to high-grade with comedonecrosis and a micro-calcification.  Inked surgical resection margins negative for invasive carcinoma.  Closest surgical margin less than 0.1 cm posterior.  DCIS present at inked anterior surgical resection H and upper outer quadrant.  Skin/nipple with DCIS present in deep dermis.  2 left sentinel lymph nodes negative for carcinoma.  pT2pN0  Per Dr.Pabbathi Haritha oncology note, MammaPrint low risk luminal a type,Per physician note, IDC ER PR positive HER-2 negative [path report stated hormone receptor status was sent out to [facility] laboratory.  I don't have the reports available at the time of dictation]  Patient underwent adjuvant radiation completed 04/11/2016. Started on adjuvant tamoxifen 20 mg daily since 03/2016.  #Uterine leiomyoma Jan 12, 2016 ultrasound pelvis transvaginal showed the uterus demonstrates suspected fibroid changes and is mildly enlarged and heterogeneous.  The endometrial stripe complex is not well visualized but measures less than a centimeter in thickness.  No dominant adnexal lesions. 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 She requests me to refill some of there medications.   Cataracts, possible due to tamoxifen use cataract extraction with intraocular lens placement can be considered  # 02/17/2020  unilateral right screening mammogram showed no mammographic evidence of malignancy.  # BCI showed no benefit of extended endocrine therapy.  # Tamoxifen 03/2016- 03/2021  03/10/2022 annual screening unilateral right mammogram   INTERVAL HISTORY Alexandra Byrd is a 57 y.o. female who has above history reviewed by me today presents for follow up visit for breast cancer  Since being off Tamoxifen, she has stopped Effexor, and resumed on Lexapro which controls her mood disorder better.  She has no new breast concerns and other new complaints.  She lives [place]. # 04/04/2023 right screening mammogram showed no mass or findings concerning for malignancy  Review of Systems  Constitutional:  Negative for appetite change, chills, fatigue and fever.  HENT:   Negative for hearing loss and voice change.   Eyes:  Negative for eye problems.  Respiratory:  Negative for chest tightness and cough.   Cardiovascular:  Negative for chest pain.  Gastrointestinal:  Negative for abdominal distention, abdominal pain and blood in stool.  Endocrine: Negative for hot flashes.  Genitourinary:  Negative for difficulty urinating and frequency.   Musculoskeletal:  Negative for arthralgias.  Skin:  Negative for itching and rash.  Neurological:  Negative for extremity weakness.  Hematological:  Negative for adenopathy.  Psychiatric/Behavioral:  Negative for confusion.     MEDICAL HISTORY:  Past Medical History:  Diagnosis Date   Anxiety    Breast cancer ([facility])    Cancer ([facility]) 11-06-15   left breast   Depression    EBV infection    GERD (gastroesophageal reflux disease)    Hashimoto's thyroiditis     SURGICAL HISTORY: Past Surgical History:  Procedure Laterality Date   BLADDER SURGERY  1974   BREAST BIOPSY Left 11-06-15   DUCTAL CARCINOMA IN SITU, NUCLEAR GRADE 2,   CESAREAN SECTION  1997, 2012   HYSTEROSCOPY WITH D & C N/A 11/20/2015   Procedure: DILATATION AND CURETTAGE /HYSTEROSCOPY;   Surgeon: Elenora Fender Ward, MD;  Location: [facility] ORS;  Service: Gynecology;  Laterality: N/A;   MASTECTOMY Left 2017   UTERINE FIBROID SURGERY  2009    SOCIAL HISTORY: Social History   Socioeconomic History   Marital status: Married    Spouse name: Not on file   Number of children: Not on file   Years of education: Not on file   Highest education level: Not on file  Occupational History   Not on file  Tobacco Use   Smoking status: Former    Current packs/day: 0.00    Types: Cigarettes    Start date: 2004    Quit date: 2009    Years since quitting: 15.6   Smokeless tobacco: Never  Vaping Use   Vaping status: Never Used  Substance and Sexual Activity   Alcohol use: Never    Alcohol/week: 0.0 standard drinks of alcohol   Drug use: Not Currently   Sexual activity: Not on file  Other Topics Concern   Not on file  Social History Narrative   Not on file   Social Determinants of Health   Financial Resource Strain: Not on file  Food Insecurity: Not on file  Transportation Needs: Not on file  Physical Activity: Not on file  Stress: Not on file  Social Connections: Not on file  Intimate Partner Violence: Not on file  FAMILY HISTORY: Family History  Problem Relation Age of Onset   Cancer Mother        thyroid/throat   Prostate cancer Paternal Uncle    Leukemia Paternal Grandfather    Breast cancer Neg Hx     ALLERGIES:  is allergic to penicillins.  MEDICATIONS:  Current Outpatient Medications  Medication Sig Dispense Refill   atorvastatin (LIPITOR) 20 MG tablet Take 20 mg by mouth at bedtime.     cariprazine (VRAYLAR) 1.5 MG capsule Take by mouth.     Cholecalciferol (VITAMIN D3) 5000 units CAPS Take 50,000 Units by mouth once a week.     Escitalopram Oxalate (LEXAPRO PO) Take 30 mg by mouth daily.     levothyroxine (SYNTHROID) 112 MCG tablet Take 112 mcg by mouth daily before breakfast.     modafinil (PROVIGIL) 100 MG tablet Take 150 mg by mouth daily.      SUMAtriptan (IMITREX) 100 MG tablet Take 1 tablet (100 mg total) by mouth as needed. 10 tablet 0   thyroid (ARMOUR) 30 MG tablet Take 30 mg by mouth daily before breakfast.  2   No current facility-administered medications for this visit.     PHYSICAL EXAMINATION: ECOG PERFORMANCE STATUS: 0 - Asymptomatic Vitals:   04/12/23 1406  BP: 108/77  Pulse: 80  Resp: 18  Temp: 97.6 F (36.4 C)   Filed Weights   04/12/23 1406  Weight: 206 lb 14.4 oz (93.8 kg)    Physical Exam Constitutional:      General: She is not in acute distress. HENT:     Head: Normocephalic and atraumatic.  Eyes:     General: No scleral icterus.    Pupils: Pupils are equal, round, and reactive to light.  Cardiovascular:     Rate and Rhythm: Normal rate and regular rhythm.     Heart sounds: Normal heart sounds.  Pulmonary:     Effort: Pulmonary effort is normal. No respiratory distress.     Breath sounds: No wheezing.  Abdominal:     General: Bowel sounds are normal. There is no distension.     Palpations: Abdomen is soft. There is no mass.     Tenderness: There is no abdominal tenderness.  Musculoskeletal:        General: No deformity. Normal range of motion.     Cervical back: Normal range of motion and neck supple.  Skin:    General: Skin is warm and dry.     Findings: No erythema or rash.  Neurological:     Mental Status: She is alert and oriented to person, place, and time. Mental status is at baseline.     Cranial Nerves: No cranial nerve deficit.     Coordination: Coordination normal.  Psychiatric:        Mood and Affect: Mood normal.        Behavior: Behavior normal.        Thought Content: Thought content normal.    Breast exam is performed in seated and lying down position.  Patient is status post left mastectomy with reconstruction.  The implant edges are intact and there is no evidence of any chest wall recurrence. Right breast no palpable masses No palpable axillary adenopathy  bilaterally.   LABORATORY DATA:  I have reviewed the data as listed Lab Results  Component Value Date   WBC 6.3 04/12/2023   HGB 13.6 04/12/2023   HCT 41.7 04/12/2023   MCV 90.7 04/12/2023   PLT 307 04/12/2023   Recent  Labs    04/12/23 1352  NA 139  K 4.1  CL 106  CO2 26  GLUCOSE 112*  BUN 12  CREATININE 0.79  CALCIUM 9.4  GFRNONAA >60  PROT 7.6  ALBUMIN 4.2  AST 31  ALT 39  ALKPHOS 101  BILITOT 0.3      RADIOGRAPHIC STUDIES: I have personally reviewed the radiological images as listed and agreed with the findings in the report.  No results found.

## 2023-04-12 NOTE — Telephone Encounter (Signed)
Unilateral right mammo order faxed to [facility] imaging center, per pt request.  To be done Aug 2025.   Fax # [phone]

## 2023-04-12 NOTE — Assessment & Plan Note (Signed)
History of pT2pN0 left breast IDC s/p left mastectomy and adjuvant radiation.  Reported to be ER PR positive HER-2 negative Labs are reviewed and discussed with patient. Finished 5 years of Tamoxifen.  Recent mammogram results were reviewed.  Continue annual mammogram

## 2023-04-12 NOTE — Assessment & Plan Note (Signed)
No leukocytosis. Check flowcytometry, result is pending

## 2023-04-13 ENCOUNTER — Encounter: Payer: Self-pay | Admitting: Oncology

## 2023-04-15 LAB — COMP PANEL: LEUKEMIA/LYMPHOMA

## 2023-11-28 ENCOUNTER — Other Ambulatory Visit: Payer: Self-pay

## 2023-11-28 ENCOUNTER — Encounter: Payer: Self-pay | Admitting: Oncology

## 2023-11-28 DIAGNOSIS — Z853 Personal history of malignant neoplasm of breast: Secondary | ICD-10-CM

## 2023-12-11 ENCOUNTER — Other Ambulatory Visit: Payer: Self-pay

## 2023-12-11 DIAGNOSIS — Z853 Personal history of malignant neoplasm of breast: Secondary | ICD-10-CM

## 2024-04-10 ENCOUNTER — Encounter: Payer: Self-pay | Admitting: Oncology

## 2024-04-11 ENCOUNTER — Telehealth: Payer: BLUE CROSS/BLUE SHIELD | Admitting: Oncology

## 2024-04-16 ENCOUNTER — Encounter: Payer: Self-pay | Admitting: Oncology

## 2024-04-25 ENCOUNTER — Encounter: Payer: Self-pay | Admitting: Oncology

## 2024-04-25 ENCOUNTER — Inpatient Hospital Stay: Attending: Oncology | Admitting: Oncology

## 2024-04-25 DIAGNOSIS — Z853 Personal history of malignant neoplasm of breast: Secondary | ICD-10-CM | POA: Diagnosis not present

## 2024-04-25 DIAGNOSIS — D72820 Lymphocytosis (symptomatic): Secondary | ICD-10-CM

## 2024-04-25 NOTE — Assessment & Plan Note (Addendum)
 No leukocytosis. This is diagnosed by her previous oncologist in 2017.  She has no leukocytosis currently.Peripheral blood flowcytometry did not show any B cell clonality. Observation.

## 2024-04-25 NOTE — Progress Notes (Signed)
 Contacted pt for Mychart visit. Pt reports having a lumpy place on Left breast- feels mushy, but no pain.

## 2024-04-25 NOTE — Assessment & Plan Note (Signed)
 History of pT2pN0 left breast IDC s/p left mastectomy and adjuvant radiation.  Reported to be ER PR positive HER-2 negative S/p  5 years of Tamoxifen .  Recent right screening mammogram -negative.  Continue annual right mammogram.   Left breast s/p mastectomy with reconstruction. She has new breast concern- mass at the lower edge of implant. Not able to perform breast exam as this is a Tele visit.  She lives 3 hours away and it is difficult for her to come to the clinic. SABRA Recommend patient to establish care with local oncologist and get breast exam.

## 2024-04-25 NOTE — Progress Notes (Signed)
 HEMATOLOGY-ONCOLOGY TeleHEALTH VISIT PROGRESS NOTE  I connected with Alexandra Byrd on 04/25/24  at  9:00 AM EDT by video enabled telemedicine visit and verified that I am speaking with the correct person using two identifiers. I discussed the limitations, risks, security and privacy concerns of performing an evaluation and management service by telemedicine and the availability of in-person appointments. The patient expressed understanding and agreed to proceed.   Other persons participating in the visit and their role in the encounter:  None  Patient's location: Home  Provider's location: office Chief Complaint: history of breast cancer   INTERVAL HISTORY Alexandra Byrd is a 58 y.o. female who has above history reviewed by me today presents for follow up visit for management of history of breast cancer.  Pt reports having a lumpy place on Left breast- feels mushy, but no pain.   Review of Systems  Constitutional:  Negative for appetite change, chills, fatigue and fever.  HENT:   Negative for hearing loss and voice change.   Eyes:  Negative for eye problems.  Respiratory:  Negative for chest tightness and cough.   Cardiovascular:  Negative for chest pain.  Gastrointestinal:  Negative for abdominal distention, abdominal pain and blood in stool.  Endocrine: Negative for hot flashes.  Genitourinary:  Negative for difficulty urinating and frequency.   Musculoskeletal:  Negative for arthralgias.  Skin:  Negative for itching and rash.  Neurological:  Negative for extremity weakness.  Hematological:  Negative for adenopathy.  Psychiatric/Behavioral:  Negative for confusion.     Past Medical History:  Diagnosis Date   Anxiety    Breast cancer (HCC)    Cancer (HCC) 11-06-15   left breast   Depression    EBV infection    GERD (gastroesophageal reflux disease)    Hashimoto's thyroiditis    Past Surgical History:  Procedure Laterality Date   BLADDER SURGERY  1974   BREAST BIOPSY  Left 11-06-15   DUCTAL CARCINOMA IN SITU, NUCLEAR GRADE 2,   CESAREAN SECTION  1997, 2012   HYSTEROSCOPY WITH D & C N/A 11/20/2015   Procedure: DILATATION AND CURETTAGE /HYSTEROSCOPY;  Surgeon: Mitzie BROCKS Ward, MD;  Location: [facility];  Service: Gynecology;  Laterality: N/A;   MASTECTOMY Left 2017   UTERINE FIBROID SURGERY  2009    Family History  Problem Relation Age of Onset   Cancer Mother        thyroid/throat   Prostate cancer Paternal Uncle    Leukemia Paternal Grandfather    Breast cancer Neg Hx     Social History   Socioeconomic History   Marital status: Married    Spouse name: Not on file   Number of children: Not on file   Years of education: Not on file   Highest education level: Not on file  Occupational History   Not on file  Tobacco Use   Smoking status: Former    Current packs/day: 0.00    Types: Cigarettes    Start date: 2004    Quit date: 2009    Years since quitting: 16.6   Smokeless tobacco: Never  Vaping Use   Vaping status: Never Used  Substance and Sexual Activity   Alcohol use: Never    Alcohol/week: 0.0 standard drinks of alcohol   Drug use: Not Currently   Sexual activity: Not on file  Other Topics Concern   Not on file  Social History Narrative   Not on file   Social Drivers of Health  Financial Resource Strain: Not on file  Food Insecurity: Not on file  Transportation Needs: Not on file  Physical Activity: Not on file  Stress: Not on file  Social Connections: Not on file  Intimate Partner Violence: Not on file    Current Outpatient Medications on File Prior to Visit  Medication Sig Dispense Refill   atorvastatin (LIPITOR) 20 MG tablet Take 20 mg by mouth at bedtime.     busPIRone (BUSPAR) 5 MG tablet Take 5 mg by mouth 3 (three) times daily.     cariprazine (VRAYLAR) 1.5 MG capsule Take by mouth.     Cholecalciferol (VITAMIN D3) 5000 units CAPS Take 50,000 Units by mouth once a week.     Escitalopram Oxalate (LEXAPRO PO) Take 30  mg by mouth daily.     levothyroxine (SYNTHROID) 112 MCG tablet Take 112 mcg by mouth daily before breakfast.     modafinil (PROVIGIL) 100 MG tablet Take 150 mg by mouth daily.     pantoprazole (PROTONIX) 40 MG tablet Take 40 mg by mouth daily.     rosuvastatin (CRESTOR) 20 MG tablet Take 20 mg by mouth at bedtime.     SUMAtriptan  (IMITREX ) 100 MG tablet Take 1 tablet (100 mg total) by mouth as needed. 10 tablet 0   thyroid (ARMOUR) 30 MG tablet Take 30 mg by mouth daily before breakfast.  2   WEGOVY 2.4 MG/0.75ML SOAJ SQ injection Inject 2.4 mg into the skin once a week.     No current facility-administered medications on file prior to visit.    Allergies  Allergen Reactions   Penicillins Rash       Observations/Objective: There were no vitals filed for this visit. There is no height or weight on file to calculate BMI.  Physical Exam Neurological:     Mental Status: She is alert.     CBC    Component Value Date/Time   WBC 6.3 04/12/2023 1352   WBC 7.9 03/10/2022 1418   RBC 4.60 04/12/2023 1352   HGB 13.6 04/12/2023 1352   HCT 41.7 04/12/2023 1352   PLT 307 04/12/2023 1352   MCV 90.7 04/12/2023 1352   MCH 29.6 04/12/2023 1352   MCHC 32.6 04/12/2023 1352   RDW 13.0 04/12/2023 1352   LYMPHSABS 2.5 04/12/2023 1352   MONOABS 0.4 04/12/2023 1352   EOSABS 0.2 04/12/2023 1352   BASOSABS 0.1 04/12/2023 1352    CMP     Component Value Date/Time   NA 139 04/12/2023 1352   K 4.1 04/12/2023 1352   CL 106 04/12/2023 1352   CO2 26 04/12/2023 1352   GLUCOSE 112 (H) 04/12/2023 1352   BUN 12 04/12/2023 1352   CREATININE 0.79 04/12/2023 1352   CALCIUM 9.4 04/12/2023 1352   PROT 7.6 04/12/2023 1352   ALBUMIN 4.2 04/12/2023 1352   AST 31 04/12/2023 1352   ALT 39 04/12/2023 1352   ALKPHOS 101 04/12/2023 1352   BILITOT 0.3 04/12/2023 1352   GFRNONAA >60 04/12/2023 1352   GFRAA >60 02/25/2020 1341     ASSESSMENT & PLAN:   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  This is diagnosed by her previous oncologist in 2017.  She has no leukocytosis currently.Peripheral blood flowcytometry did not show any B cell clonality. Observation.   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  Reported to be ER PR positive HER-2 negative S/p  5 years of Tamoxifen .  Recent right screening mammogram -negative.  Continue annual right mammogram.   Left breast s/p mastectomy with reconstruction. She has new breast concern- mass at the lower edge of implant. Not able to perform breast exam as this is a Tele visit.  She lives 3 hours away and it is difficult for her to come to the clinic. SABRA Recommend patient to establish care with local oncologist and get breast exam.    No orders of the defined types were placed in this encounter.  Follow up PRN   I provided 25 minutes of face-to-face video visit time during this encounter, and > 50% was spent counseling as documented under my assessment & plan.  Zelphia Cap, MD 04/25/2024 12:28 PM
